# Patient Record
Sex: Female | Born: 1980 | Race: Black or African American | Hispanic: No | Marital: Single | Smoking: Never smoker
Health system: Southern US, Community
[De-identification: ages and names within clinical notes are randomized; demographics above are authoritative.]

---

## 2004-12-15 ENCOUNTER — Ambulatory Visit: Payer: Self-pay | Admitting: *Deleted

## 2004-12-15 ENCOUNTER — Inpatient Hospital Stay (HOSPITAL_COMMUNITY): Admission: AD | Admit: 2004-12-15 | Discharge: 2004-12-19 | Payer: Self-pay | Admitting: Obstetrics and Gynecology

## 2004-12-15 ENCOUNTER — Ambulatory Visit: Payer: Self-pay | Admitting: Family Medicine

## 2004-12-22 ENCOUNTER — Ambulatory Visit: Payer: Self-pay | Admitting: Family Medicine

## 2005-01-04 ENCOUNTER — Inpatient Hospital Stay (HOSPITAL_COMMUNITY): Admission: AD | Admit: 2005-01-04 | Discharge: 2005-01-04 | Payer: Self-pay | Admitting: Obstetrics and Gynecology

## 2005-01-05 ENCOUNTER — Ambulatory Visit: Payer: Self-pay | Admitting: Family Medicine

## 2005-01-12 ENCOUNTER — Ambulatory Visit: Payer: Self-pay | Admitting: Family Medicine

## 2005-01-19 ENCOUNTER — Ambulatory Visit: Payer: Self-pay | Admitting: Family Medicine

## 2005-02-02 ENCOUNTER — Ambulatory Visit: Payer: Self-pay | Admitting: Family Medicine

## 2005-02-16 ENCOUNTER — Ambulatory Visit: Payer: Self-pay | Admitting: Family Medicine

## 2005-03-02 ENCOUNTER — Inpatient Hospital Stay (HOSPITAL_COMMUNITY): Admission: AD | Admit: 2005-03-02 | Discharge: 2005-03-02 | Payer: Self-pay | Admitting: Obstetrics & Gynecology

## 2005-03-02 ENCOUNTER — Ambulatory Visit: Payer: Self-pay | Admitting: Family Medicine

## 2005-03-09 ENCOUNTER — Encounter: Payer: Self-pay | Admitting: Family Medicine

## 2005-03-09 ENCOUNTER — Ambulatory Visit: Payer: Self-pay | Admitting: Family Medicine

## 2005-03-16 ENCOUNTER — Ambulatory Visit: Payer: Self-pay | Admitting: Family Medicine

## 2005-03-17 ENCOUNTER — Ambulatory Visit: Payer: Self-pay | Admitting: Obstetrics and Gynecology

## 2005-03-17 ENCOUNTER — Inpatient Hospital Stay (HOSPITAL_COMMUNITY): Admission: AD | Admit: 2005-03-17 | Discharge: 2005-03-20 | Payer: Self-pay | Admitting: Obstetrics & Gynecology

## 2006-08-02 ENCOUNTER — Emergency Department (HOSPITAL_COMMUNITY): Admission: EM | Admit: 2006-08-02 | Discharge: 2006-08-02 | Payer: Self-pay | Admitting: Emergency Medicine

## 2006-11-18 ENCOUNTER — Emergency Department (HOSPITAL_COMMUNITY): Admission: EM | Admit: 2006-11-18 | Discharge: 2006-11-18 | Payer: Self-pay | Admitting: Emergency Medicine

## 2008-01-11 ENCOUNTER — Inpatient Hospital Stay (HOSPITAL_COMMUNITY): Admission: AD | Admit: 2008-01-11 | Discharge: 2008-01-11 | Payer: Self-pay | Admitting: Obstetrics and Gynecology

## 2008-01-28 ENCOUNTER — Other Ambulatory Visit: Admission: RE | Admit: 2008-01-28 | Discharge: 2008-01-28 | Payer: Self-pay | Admitting: Obstetrics and Gynecology

## 2008-02-18 ENCOUNTER — Ambulatory Visit (HOSPITAL_COMMUNITY): Admission: RE | Admit: 2008-02-18 | Discharge: 2008-02-18 | Payer: Self-pay | Admitting: Obstetrics and Gynecology

## 2008-04-01 ENCOUNTER — Inpatient Hospital Stay (HOSPITAL_COMMUNITY): Admission: AD | Admit: 2008-04-01 | Discharge: 2008-04-01 | Payer: Self-pay | Admitting: Obstetrics and Gynecology

## 2008-04-09 ENCOUNTER — Inpatient Hospital Stay (HOSPITAL_COMMUNITY): Admission: AD | Admit: 2008-04-09 | Discharge: 2008-04-09 | Payer: Self-pay | Admitting: Obstetrics and Gynecology

## 2008-06-29 ENCOUNTER — Inpatient Hospital Stay (HOSPITAL_COMMUNITY): Admission: AD | Admit: 2008-06-29 | Discharge: 2008-06-29 | Payer: Self-pay | Admitting: Obstetrics and Gynecology

## 2008-07-01 ENCOUNTER — Inpatient Hospital Stay (HOSPITAL_COMMUNITY): Admission: AD | Admit: 2008-07-01 | Discharge: 2008-07-03 | Payer: Self-pay | Admitting: Obstetrics and Gynecology

## 2008-07-06 ENCOUNTER — Encounter: Admission: RE | Admit: 2008-07-06 | Discharge: 2008-07-24 | Payer: Self-pay | Admitting: Obstetrics and Gynecology

## 2008-07-09 ENCOUNTER — Observation Stay (HOSPITAL_COMMUNITY): Admission: AD | Admit: 2008-07-09 | Discharge: 2008-07-10 | Payer: Self-pay | Admitting: Obstetrics and Gynecology

## 2008-07-09 ENCOUNTER — Encounter (INDEPENDENT_AMBULATORY_CARE_PROVIDER_SITE_OTHER): Payer: Self-pay | Admitting: Obstetrics and Gynecology

## 2009-10-25 ENCOUNTER — Emergency Department (HOSPITAL_COMMUNITY): Admission: EM | Admit: 2009-10-25 | Discharge: 2009-10-25 | Payer: Self-pay | Admitting: Emergency Medicine

## 2011-04-11 NOTE — Discharge Summary (Signed)
[name], [name]      ACCOUNT NO.:  [id number]   MEDICAL RECORD NO.:  [id number]          PATIENT TYPE:  OBV   LOCATION:  9307                          FACILITY:  [facility]   PHYSICIAN:  Juluis Mire, M.D.   DATE OF BIRTH:  [birth year]   DATE OF ADMISSION:  07/09/2008  DATE OF DISCHARGE:  07/10/2008                               DISCHARGE SUMMARY   ADMITTING DIAGNOSIS:  Retained products of conception with associated  bleeding.   DISCHARGE DIAGNOSIS:  Retained products of conception with associated  bleeding.   PROCEDURE:  D&C.   For complete history and physical, see written note.   COURSE IN THE HOSPITAL:  The patient underwent D&C.  Postop did well.  Postop hemoglobin was 7.2.  The next morning she was afebrile, stable  vital signs, fundus was firm, and bleeding was minimal and she was  discharged home at that time.   In terms of complications, none were encountered during her stay in the  hospital.  The patient was discharged home in stable condition.   DISPOSITION:  The patient was sent home with Methergine.  She will also  take a short course of Keflex.  Darvocet as needed for pain.  Iron  sulfate supplementation.  She is given routine instructions.  She will  call with increasing bleeding, fever, or pain.  Follow up in the office  in one week.      Juluis Mire, M.D.  Electronically Signed     JSM/MEDQ  D:  07/10/2008  T:  07/11/2008  Job:  [id number]

## 2011-04-11 NOTE — Op Note (Signed)
NAMEFRANCA, [name]      ACCOUNT NO.:  [id number]   MEDICAL RECORD NO.:  [id number]          PATIENT TYPE:  INP   LOCATION:  9307                          FACILITY:  [facility]   PHYSICIAN:  Guy Sandifer. Henderson Cloud, M.D. DATE OF BIRTH:  Nov 26, 1981   DATE OF PROCEDURE:  07/09/2008  DATE OF DISCHARGE:                               OPERATIVE REPORT   PREOPERATIVE DIAGNOSIS:  Postpartum hemorrhage.   POSTOPERATIVE DIAGNOSIS:  Postpartum hemorrhage probable retained  products of conception.   PROCEDURE:  Dilatation and evacuation.   SURGEON:  Guy Sandifer. Henderson Cloud, MD   ANESTHESIA:  MAC, Angelica Pou, MD   SPECIMENS:  Endometrial curettings to pathology.   ESTIMATED BLOOD LOSS:  Greater than 500 mL.   INDICATIONS AND CONSENT:  This patient is a 30 year old black female G2,  P2 status post uncomplicated vaginal delivery on July 01, 2008.  She  was discharged home two days later doing well.  She states that she did  well until about 8:30 this evening when she went to the bathroom and had  heavy heavy bleeding into the toilet.  She stated that it was running  out as if she was urinating.  She arrived at [facility] via EMS  with a blood-stained pad beneath her on the stretcher.  Her vital signs  were stable.  Examination in triage revealed at least 300-400 mL of  blood clot in the vagina.  I could not adequately visualize the cervix  secondary to the bleeding.  The patient was too anxious and  uncomfortable to allow any further examination.  CBC, metabolic panel,  and a whole clot were drawn.  Probable dilatation and evacuation was  discussed with the patient.  Potential risks and complications were  reviewed including but not limited to infection, uterine perforation,  organ damage, bleeding requiring transfusion of blood products with HIV  and hepatitis acquisition, DVT, PE, pneumonia, intrauterine synechia,  and secondary infertility and hysterectomy.  All questions were  answered.  Consent was signed on the chart.   PROCEDURE:  The patient was taken to the operating room where she was  sedated and placed in a dorsal lithotomy position.  She was then  prepped, bladder straight catheterized, and she was draped in the  sterile fashion.  Another 200-300 mL of clot were expelled from the  uterus and the vagina with massage.  Pitocin 20 units per L was started  in her IV.  An #8 curved suction curette was then gently passed after  grasping the anterior cervical lip with a ring clamp.  Suction curettage  was carried out.  Further massage on the uterus was carried out after  again removing the instruments.  She was given Methergine 0.2 mg IM as  well.  At this point, the uterus began to firm up quite well.  Gentle  sharp curettage was carried out with alternating suction curettage.  Good hemostasis was attained.  Ultrasound was called in the room.  Ultrasound revealed a small focus of probable remaining tissue.  This  was removed with a sharp curette under direct visualization with the  ultrasound.  Repeated passes with a sharp  curette as  well as suction curettage revealed the cavity to be clean.  It also  appeared to be clean on ultrasound and a good hemostasis was noted.  The  patient's CBC returned during the case with a hemoglobin of  approximately 11.7.  The patient was stable and all counts were correct.  She was taken to the recovery room in stable condition.      Guy Sandifer Henderson Cloud, M.D.  Electronically Signed     JET/MEDQ  D:  07/09/2008  T:  07/10/2008  Job:  [id number]

## 2011-04-14 NOTE — Discharge Summary (Signed)
[name], [name]            ACCOUNT NO.:  [id number]   MEDICAL RECORD NO.:  [id number]          PATIENT TYPE:  INP   LOCATION:  9155                          FACILITY:  [facility]   PHYSICIAN:  Conni Elliot, M.D.DATE OF BIRTH:  November 16, 1981   DATE OF ADMISSION:  12/15/2004  DATE OF DISCHARGE:  12/19/2004                                 DISCHARGE SUMMARY   ADMISSION DIAGNOSES:  1.  Vaginal bleeding, status post RhoGAM x 1.  2.  Intrauterine pregnancy at 26-2/7 weeks by ultrasound.  3.  Preterm uterine contractions.   DISCHARGE DIAGNOSES:  1.  Threatened preterm labor, controlled with Procardia.  2.  Viable intrauterine pregnancy at 26-5/7 weeks on day of discharge.  3.  Status post betamethasone x 3.  4.  Group B Streptococcus negative.   PROCEDURES:  1.  The patient received RhoGAM on the day of admission.  2.  Ultrasound dated December 16, 2004 shows a single intrauterine pregnancy      with a heart rate of 131 beats per minute in the vertex presentation,      with an anterior placenta, no previa, grade 1 placenta, normal amniotic      fluid.  Estimated date of 26 weeks and 2 days, with a weight of 867 g,      and a cervical length of 1.5 cm transvaginally.  3.  Betamethasone x 2.   HISTORY AND PHYSICAL:  The patient is a 30 year old G1 P0 at 26-2/7 weeks  based on an ultrasound on admission, with estimated due date of March 22, 2005, who presented with preterm uterine contractions and a shortened  cervix.  The patient reported contractions on admission, some vaginal  bleeding.  She reported intact membranes and normal fetal activity.  The  patient initially presented to [facility] with some vaginal  spotting and no prenatal care and was referred to [facility] and was  initially admitted by Dr. Henderson Cloud.   PRENATAL COURSE:  The patient has no prenatal care, is on no medications,  and has no known drug allergies.   OBSTETRICAL HISTORY:  G1.   GYNECOLOGIC  HISTORY:  No abnormal Paps.  No cervical implementations.  No  STDs.   MEDICAL HISTORY:  Negative.   SURGICAL HISTORY:  Negative.   FAMILY HISTORY:  Mother and father are both healthy.   SOCIAL HISTORY:  No alcohol, tobacco, or drugs.  Lives with a friend here in  [place] and does not currently work.  Has reliable transportation to and  from the hospital.   PRENATAL LABORATORY RESULTS:  Are incomplete at this time.  The patient has  B negative blood with a negative antibody screen.  Hemoglobin 11.7,  platelets 259.  Rubella immune.  Hepatitis B surface antigen negative.   PHYSICAL EXAMINATION:  Significant for a cervix with fingertip external, 25%  effaced, and a developed lower segment.  Baseline fetal heart rate was 130,  with good variability.  No accels and no decels.  Contractions every two to  three minutes, 30-60 seconds in duration, and mild intensity.   The patient was admitted.  Was treated with Unasyn 3 g IV q.6 h. x 4 days,  and was placed on magnesium and titrated to effect.   HOSPITAL COURSE:  1.  Preterm uterine contractions.  The patient's contractions were managed      with magnesium at 2.5 g/hour.  She was gradually weaned off the      magnesium and switched to Procardia 10 mg p.o. q.6 h. and was      contraction-free on Procardia for one day.  She was also managed with      Motrin 600 mg q.6 h. x 72 hours.  The patient had been off both      magnesium and Motrin 24 hours before discharge and was contraction free      for 24 hours at the time of discharge.  She was discharged home with      Procardia 10 mg p.o. q.6 h., and to follow up in the high risk clinic.      The patient was found to be GBS negative during the hospitalization.  2.  Preterm labor.  The patient was given betamethasone x 2 on December 16, 2004 and December 17, 2004.  She was observed in the hospital for 48      hours after the second dose of betamethasone before discharge home.    DISCHARGE MEDICATIONS:  1.  Procardia 10 mg p.o. q.6 h.  2.  Prenatal vitamins 1 p.o. daily.   FOLLOW-UP INSTRUCTIONS:  The patient is instructed to call and schedule an  appointment at the high-risk clinic.  Number [phone] was provided for the  patient, and she expressed an understanding to do this.  The patient states  that she has good transportation and will be able to stay at home and able  to perform strict bed rest with bathroom privileges, shower privileges, and  cooking privileges.     ______________________________  Priscille Heidelberg. Pamalee Leyden, MD                  ______________________________  Conni Elliot, M.D.    WTP/MEDQ  D:  12/19/2004  T:  12/19/2004  Job:  [id number]

## 2011-08-18 LAB — URINALYSIS, ROUTINE W REFLEX MICROSCOPIC
Bilirubin Urine: NEGATIVE
Ketones, ur: 15 — AB
Nitrite: NEGATIVE
Protein, ur: NEGATIVE
Urobilinogen, UA: 0.2
pH: 6.5

## 2011-08-18 LAB — WET PREP, GENITAL: Clue Cells Wet Prep HPF POC: NONE SEEN

## 2011-08-18 LAB — HCG, QUANTITATIVE, PREGNANCY: hCG, Beta Chain, Quant, S: 161249 — ABNORMAL HIGH

## 2011-08-18 LAB — URINE CULTURE: Culture: NO GROWTH

## 2011-08-18 LAB — POCT PREGNANCY, URINE: Preg Test, Ur: POSITIVE

## 2011-08-18 LAB — URINE MICROSCOPIC-ADD ON

## 2011-08-18 LAB — GC/CHLAMYDIA PROBE AMP, GENITAL: Chlamydia, DNA Probe: NEGATIVE

## 2011-08-25 LAB — COMPREHENSIVE METABOLIC PANEL
ALT: 24
AST: 23
Alkaline Phosphatase: 111
CO2: 25
Chloride: 107
GFR calc Af Amer: >60
GFR calc non Af Amer: >60
Glucose, Bld: 96
Potassium: 3.8
Sodium: 138

## 2011-08-25 LAB — CBC
HCT: 32.6 — ABNORMAL LOW
HCT: 34.3 — ABNORMAL LOW
Hemoglobin: 11 — ABNORMAL LOW
Hemoglobin: 11.6 — ABNORMAL LOW
Hemoglobin: 11.7 — ABNORMAL LOW
MCHC: 33.7
MCHC: 33.8
MCV: 98
MCV: 99.4
RBC: 3.32 — ABNORMAL LOW
RBC: 3.51 — ABNORMAL LOW
RBC: 3.56 — ABNORMAL LOW
RDW: 13
RDW: 13
WBC: 13.2 — ABNORMAL HIGH
WBC: 13.4 — ABNORMAL HIGH

## 2011-08-25 LAB — RH IMMUNE GLOB WKUP(>/=20WKS)(NOT WOMEN'S HOSP)

## 2011-08-25 LAB — SAMPLE TO BLOOD BANK

## 2011-10-13 ENCOUNTER — Emergency Department (INDEPENDENT_AMBULATORY_CARE_PROVIDER_SITE_OTHER)
Admission: EM | Admit: 2011-10-13 | Discharge: 2011-10-13 | Disposition: A | Discharge status: Home / Self Care | Payer: Self-pay | Source: Home / Self Care | Attending: Family Medicine | Admitting: Family Medicine

## 2011-10-13 DIAGNOSIS — J069 Acute upper respiratory infection, unspecified: Secondary | ICD-10-CM

## 2011-10-13 MED ORDER — GUAIFENESIN-CODEINE 100-10 MG/5ML PO SYRP: 5.0000 mL | ORAL_SOLUTION | Freq: Four times a day (QID) | ORAL | Status: AC | PRN

## 2011-10-13 MED ORDER — AZITHROMYCIN 250 MG PO TABS: 250.0000 mg | ORAL_TABLET | Freq: Every day | ORAL | Status: AC

## 2011-10-13 NOTE — ED Notes (Signed)
C/o frontal headache, runny nose, dry cough, bodyaches and pain in back and abdomen with coughing since Tuesday.  Reports she vomited x1 this am.

## 2011-10-13 NOTE — ED Provider Notes (Signed)
History     CSN: [id number] Arrival date & time: 10/13/2011 12:34 PM   First MD Initiated Contact with Patient 10/13/11 1311      Chief Complaint  Patient presents with  . Headache    (Consider location/radiation/quality/duration/timing/severity/associated sxs/prior treatment) Patient is a 30 y.o. female presenting with headaches. The history is provided by the patient.  Headache The primary symptoms include fever. Primary symptoms comment: , fever and cough  has had a fever to 101. Fever has resolved. Cough is dry. Headache is frontal and maxillary  History reviewed. No pertinent past medical history.  History reviewed. No pertinent past surgical history.  History reviewed. No pertinent family history.  History  Substance Use Topics  . Smoking status: Never Smoker   . Smokeless tobacco: Not on file  . Alcohol Use: Yes     occasional    OB History    Grav Para Term Preterm Abortions TAB SAB Ect Mult Living                  Review of Systems  Constitutional: Positive for fever.  HENT: Negative.   Eyes: Negative.   Respiratory: Negative.   Cardiovascular: Negative.   Gastrointestinal: Negative.   Genitourinary: Negative.   Musculoskeletal: Negative.   Skin: Negative.     Allergies  Review of patient's allergies indicates no known allergies.  Home Medications   Current Outpatient Rx  Name Route Sig Dispense Refill  . IBUPROFEN 200 MG PO TABS Oral Take 400 mg by mouth as needed.      . AZITHROMYCIN 250 MG PO TABS Oral Take 1 tablet (250 mg total) by mouth daily. Take first 2 tablets together, then 1 every day until finished. 6 tablet 0  . GUAIFENESIN-CODEINE 100-10 MG/5ML PO SYRP Oral Take 5 mLs by mouth every 6 (six) hours as needed for cough. 120 mL 0    BP 111/77  Pulse 94  Temp(Src) 99.4 F (37.4 C) (Oral)  Resp 16  SpO2 100%  LMP 10/06/2011  Physical Exam  ED Course  Procedures (including critical care time)  Labs Reviewed - No data to  display No results found.   1. URI (upper respiratory infection)       MDM          Randa Spike, MD 10/13/11 [phone]

## 2012-12-14 ENCOUNTER — Emergency Department (INDEPENDENT_AMBULATORY_CARE_PROVIDER_SITE_OTHER)
Admission: EM | Admit: 2012-12-14 | Discharge: 2012-12-14 | Disposition: A | Discharge status: Home / Self Care | Payer: Self-pay | Source: Home / Self Care

## 2012-12-14 ENCOUNTER — Encounter (HOSPITAL_COMMUNITY): Payer: Self-pay | Admitting: Emergency Medicine

## 2012-12-14 DIAGNOSIS — N390 Urinary tract infection, site not specified: Secondary | ICD-10-CM

## 2012-12-14 LAB — POCT URINALYSIS DIP (DEVICE)
Bilirubin Urine: NEGATIVE
Glucose, UA: NEGATIVE mg/dL
Nitrite: NEGATIVE
Urobilinogen, UA: 1 mg/dL (ref 0.0–1.0)
pH: 7 (ref 5.0–8.0)

## 2012-12-14 MED ORDER — NITROFURANTOIN MONOHYD MACRO 100 MG PO CAPS: 100.0000 mg | ORAL_CAPSULE | Freq: Two times a day (BID) | ORAL | Status: DC

## 2012-12-14 NOTE — ED Provider Notes (Signed)
History     CSN: [id number]  Arrival date & time 12/14/12  1121   None     Chief Complaint  Patient presents with  . Urinary Tract Infection     Patient is a 32 y.o. female presenting with frequency. The history is provided by the patient.  Urinary Frequency This is a new problem. The current episode started more than 2 days ago. The problem occurs constantly. The problem has been gradually worsening. Nothing relieves the symptoms.  Pt reports 2 day h/o frequency and urgency of urination. Denies dysuria. Additional symptoms include low back pain and mild suprapubic pressure. Denies fever, abd pain, unusual vag d/c or other gyn symptoms. Does admits to mild nausea w/ one episode of vomiting.  LMP 11/21/2012 History reviewed. No pertinent past medical history.  History reviewed. No pertinent past surgical history.  No family history on file.  History  Substance Use Topics  . Smoking status: Never Smoker   . Smokeless tobacco: Not on file  . Alcohol Use: Yes     Comment: occasional    OB History    Grav Para Term Preterm Abortions TAB SAB Ect Mult Living                  Review of Systems  Genitourinary: Positive for frequency.  All other systems reviewed and are negative.    Allergies  Review of patient's allergies indicates no known allergies.  Home Medications   Current Outpatient Rx  Name  Route  Sig  Dispense  Refill  . IBUPROFEN 200 MG PO TABS   Oral   Take 400 mg by mouth as needed.           Marland Kitchen NITROFURANTOIN MONOHYD MACRO 100 MG PO CAPS   Oral   Take 1 capsule (100 mg total) by mouth 2 (two) times daily. For 7 days   14 capsule   0     BP 117/80  Pulse 73  Temp 97.9 F (36.6 C) (Oral)  Resp 18  SpO2 99%  LMP 11/21/2012  Physical Exam  Vitals reviewed. Constitutional: She is oriented to person, place, and time. She appears well-developed and well-nourished.  HENT:  Head: Normocephalic and atraumatic.  Neck: Neck supple.    Cardiovascular: Normal rate.   Pulmonary/Chest: Effort normal.  Abdominal: Soft. Bowel sounds are normal. There is no tenderness.  Musculoskeletal: Normal range of motion.  Neurological: She is alert and oriented to person, place, and time.  Skin: Skin is warm and dry.    ED Course  Procedures   Labs Reviewed  POCT URINALYSIS DIP (DEVICE) - Abnormal; Notable for the following:    Ketones, ur TRACE (*)     Hgb urine dipstick TRACE (*)     Leukocytes, UA SMALL (*)  Biochemical Testing Only. Please order routine urinalysis from main lab if confirmatory testing is needed.   All other components within normal limits   No results found.   1. Urinary tract infection       MDM  2 days of UTI like symptoms. Small leukocyte esterase, no blood. Preg test neg. Will tx since pt symptomatic. Macrobid x 7 days.        Leanne Chang, NP 12/14/12 1312

## 2012-12-14 NOTE — ED Notes (Signed)
Pt c/o poss UTI x7  Sx include: frequency, urgency, nauseas, vomiting, dark urine, back pain x2 days Denies: hematuria, dysuria, fevers, diarrhea, vag discharge LMP: 11/21/12  She is alert w/no signs of acute distress.

## 2012-12-16 NOTE — ED Provider Notes (Signed)
Medical screening examination/treatment/procedure(s) were performed by resident physician or non-physician practitioner and as supervising physician I was immediately available for consultation/collaboration.   Barkley Bruns MD.    Linna Hoff, MD 12/16/12 [phone]

## 2014-01-09 ENCOUNTER — Encounter (HOSPITAL_COMMUNITY): Payer: Self-pay | Admitting: Emergency Medicine

## 2014-01-09 ENCOUNTER — Emergency Department (INDEPENDENT_AMBULATORY_CARE_PROVIDER_SITE_OTHER)
Admission: EM | Admit: 2014-01-09 | Discharge: 2014-01-09 | Disposition: A | Discharge status: Home / Self Care | Payer: Self-pay | Source: Home / Self Care | Attending: Emergency Medicine | Admitting: Emergency Medicine

## 2014-01-09 DIAGNOSIS — A389 Scarlet fever, uncomplicated: Secondary | ICD-10-CM

## 2014-01-09 DIAGNOSIS — J019 Acute sinusitis, unspecified: Secondary | ICD-10-CM

## 2014-01-09 LAB — POCT RAPID STREP A: Streptococcus, Group A Screen (Direct): POSITIVE — AB

## 2014-01-09 MED ORDER — AMOXICILLIN 500 MG PO CAPS: 500.0000 mg | ORAL_CAPSULE | Freq: Three times a day (TID) | ORAL | Status: DC

## 2014-01-09 NOTE — ED Notes (Signed)
Pt  Reports  Symptoms  Of   Rash    On  Face  That  Itches   As   Well  As  Some  Nasal  Congestion          -  Pt  Reports  Had  Flu  Earlier  pta and  That that is  Better

## 2014-01-09 NOTE — ED Provider Notes (Signed)
  Chief Complaint   Chief Complaint  Patient presents with  . URI    History of Present Illness   [name] is a 33 year old female who's had a one-week history which began with flulike symptoms with generalized aching, vomiting, and subjective fever and then she developed cough, sore throat, and swelling in the neck area. This has all gotten better but now she has nasal congestion with yellow to green to brown drainage, and pressure. Ever since yesterday she's had some itching and rash on her face. No exposure to strep.  Review of Systems   Other than as noted above, the patient denies any of the following symptoms: Systemic:  No fevers, chills, sweats, or myalgias. Eye:  No redness or discharge. ENT:  No ear pain, headache, nasal congestion, drainage, sinus pressure, or sore throat. Neck:  No neck pain, stiffness, or swollen glands. Lungs:  No cough, sputum production, hemoptysis, wheezing, chest tightness, shortness of breath or chest pain. GI:  No abdominal pain, nausea, vomiting or diarrhea.  PMFSH   Past medical history, family history, social history, meds, and allergies were reviewed.   Physical exam   Vital signs:  BP 116/77  Pulse 79  Temp(Src) 98.3 F (36.8 C) (Oral)  Resp 16  SpO2 99%  LMP 12/15/2013 General:  Alert and oriented.  In no distress.  Skin warm and dry. Eye:  No conjunctival injection or drainage. Lids were normal. ENT:  TMs and canals were normal, without erythema or inflammation.  Nasal mucosa was congested with thick, yellow drainage.  Mucous membranes were moist.  Pharynx was erythematous and swollen without any exudate or drainage.  There were no oral ulcerations or lesions. Neck:  Supple, no adenopathy, tenderness or mass. Lungs:  No respiratory distress.  Lungs were clear to auscultation, without wheezes, rales or rhonchi.  Breath sounds were clear and equal bilaterally.  Heart:  Regular rhythm, without gallops, murmers or rubs. Skin:   Clear, warm, and dry, she has a fine, erythematous, sandpaper lesion on her face and is best around her eyes.  Labs   Results for orders placed during the hospital encounter of 01/09/14  POCT RAPID STREP A ([facility]RG CARE ONLY)      Result Value Ref Range   Streptococcus, Group A Screen (Direct) POSITIVE (*) NEGATIVE    Assessment     The primary encounter diagnosis was Scarlet fever. A diagnosis of Acute sinusitis was also pertinent to this visit.  Plan    1.  Meds:  The following meds were prescribed:   Discharge Medication List as of 01/09/2014  1:31 PM    START taking these medications   Details  amoxicillin (AMOXIL) 500 MG capsule Take 1 capsule (500 mg total) by mouth 3 (three) times daily., Starting 01/09/2014, Until Discontinued, Normal        2.  Patient Education/Counseling:  The patient was given appropriate handouts, self care instructions, and instructed in symptomatic relief.  Instructed to get extra fluids, rest, and use a cool mist vaporizer.    3.  Follow up:  The patient was told to follow up here if no better in 3 to 4 days, or sooner if becoming worse in any way, and given some red flag symptoms such as increasing fever, difficulty breathing, chest pain, or persistent vomiting which would prompt immediate return.  Follow up here as needed.      Reuben Likesavid C Keiran Gaffey, MD 01/09/14 [id number]

## 2014-01-09 NOTE — Discharge Instructions (Signed)
Most upper respiratory infections are caused by viruses and do not require antibiotics.  We try to save the antibiotics for when we really need them to prevent bacteria from developing resistance to them.  Here are a few hints about things that can be done at home to help get over an upper respiratory infection quicker: ° °Get extra sleep and extra fluids.  Get 7 to 9 hours of sleep per night and 6 to 8 glasses of water a day.  Getting extra sleep keeps the immune system from getting run down.  Most people with an upper respiratory infection are a little dehydrated.  The extra fluids also keep the secretions liquified and easier to deal with.  Also, get extra vitamin C.  4000 mg per day is the recommended dose. °For the aches, headache, and fever, acetaminophen or ibuprofen are helpful.  These can be alternated every 4 hours.  People with liver disease should avoid large amounts of acetaminophen, and people with ulcer disease, gastroesophageal reflux, gastritis, congestive heart failure, chronic kidney disease, coronary artery disease and the elderly should avoid ibuprofen. °For nasal congestion try Mucinex-D, or if you're having lots of sneezing or clear nasal drainage use Zyrtec-D. People with high blood pressure can take these if their blood pressure is controlled, if not, it's best to avoid the forms with a "D" (decongestants).  You can use the plain Mucinex, Allegra, Claritin, or Zyrtec even if your blood pressure is not controlled.   °A Saline nasal spray such as Ocean Spray can also help.  You can add a decongestant sprays such as Afrin, but you should not use the decongestant sprays for more than 3 or 4 days since they can be habituating.  Breathe Rite nasal strips can also offer a non-drug alternative treatment to nasal congestion, especially at night. °For people with symptoms of sinusitis, sleeping with your head elevated can be helpful.  For sinus pain, moist, hot compresses to the face may provide some  relief.  Many people find that inhaling steam as in a shower or from a pot of steaming water can help. °For any viral infection, zinc containing lozenges such as Cold-Eze or Zicam are helpful.  Zinc helps to fight viral infection.  Hot salt water gargles (8 oz of hot water, 1/2 tsp of table salt, and a pinch of baking soda) can give relief as well as hot beverages such as hot tea.  Sucrets extra strength lozenges will help the sore throat.  °For the cough, take Delsym 2 tsp every 12 hours.  It has also been found recently that Aleve can help control a cough.  The dose is 1 to 2 tablets twice daily with food.  This can be combined with Delsym. (Note, if you are taking ibuprofen, you should not take Aleve as well--take one or the other.) °A cool mist vaporizer will help keep your mucous membranes from drying out.  ° °It's important when you have an upper respiratory infection not to pass the infection to others.  This involves being very careful about the following: ° °Frequent hand washing or use of hand sanitizer, especially after coughing, sneezing, blowing your nose or touching your face, nose or eyes. °Do not shake hands or touch anyone and try to avoid touching surfaces that other people use such as doorknobs, shopping carts, telephones and computer keyboards. °Use tissues and dispose of them properly in a garbage can or ziplock bag. °Cough into your sleeve. °Do not let others eat or   drink after you. ° °It's also important to recognize the signs of serious illness and get evaluated if they occur: °Any respiratory infection that lasts more than 7 to 10 days.  Yellow nasal drainage and sputum are not reliable indicators of a bacterial infection, but if they last for more than 1 week, see your doctor. °Fever and sore throat can indicate strep. °Fever and cough can indicate influenza or pneumonia. °Any kind of severe symptom such as difficulty breathing, intractable vomiting, or severe pain should prompt you to see  a doctor as soon as possible. ° ° °Your body's immune system is really the thing that will get rid of this infection.  Your immune system is comprised of 2 types of specialized cells called T cells and B cells.  T cells coordinate the array of cells in your body that engulf invading bacteria or viruses while B cells orchestrate the production of antibodies that neutralize infection.  Anything we do or any medications we give you, will just strengthen your immune system or help it clear up the infection quicker.  Here are a few helpful hints to improve your immune system to help overcome this illness or to prevent future infections: °· A few vitamins can improve the health of your immune system.  That's why your diet should include plenty of fruits, vegetables, fish, nuts, and whole grains. °· Vitamin A and bet-carotene can increase the cells that fight infections (T cells and B cells).  Vitamin A is abundant in dark greens and orange vegetables such as spinach, greens, sweet potatoes, and carrots. °· Vitamin B6 contributes to the maturation of white blood cells, the cells that fight disease.  Foods with vitamin B6 include cold cereal and bananas. °· Vitamin C is credited with preventing colds because it increases white blood cells and also prevents cellular damage.  Citrus fruits, peaches and green and red bell peppers are all hight in vitamin C. °· Vitamin E is an anti-oxidant that encourages the production of natural killer cells which reject foreign invaders and B cells that produce antibodies.  Foods high in vitamin E include wheat germ, nuts and seeds. °· Foods high in omega-3 fatty acids found in foods like salmon, tuna and mackerel boost your immune system and help cells to engulf and absorb germs. °· Probiotics are good bacteria that increase your T cells.  These can be found in yogurt and are available in supplements such as Culturelle or Align. °· Moderate exercise increases the strength of your immune  system and your ability to recover from illness.  I suggest 3 to 5 moderate intensity 30 minute workouts per week.   °· Sleep is another component of maintaining a strong immune system.  It enables your body to recuperate from the day's activities, stress and work.  My recommendation is to get between 7 and 9 hours of sleep per night. °· If you smoke, try to quit completely or at least cut down.  Drink alcohol only in moderation if at all.  No more than 2 drinks daily for men or 1 for women. °· Get a flu vaccine early in the fall or if you have not gotten one yet, once this illness has run its course.  If you are over 65, a smoker, or an asthmatic, get a pneumococcal vaccine. °· My final recommendation is to maintain a healthy weight.  Excess weight can impair the immune system by interfering with the way the immune system deals with invading viruses or   bacteria.    Scarlet Fever Scarlet fever is an infectious disease that can develop with a strep throat. It usually occurs in school-age children and can spread from person to person (contagious). Scarlet fever seldom causes any long-term problems.  CAUSES Scarlet fever is caused by the bacteria (Streptococcus pyogenes).  SYMPTOMS  Sore throat, fever, and headache.  Mild abdominal pain.  Tongue may become red (strawberry tongue).  Red rash that starts 1 to 2 days after fever begins. Rash starts on face and spreads to rest of body.  Rash looks and feels like "goose bumps" or sandpaper and may itch.  Rash lasts 3 to 7 days and then starts to peel. Peeling may last 2 weeks. DIAGNOSIS Scarlet fever typically is diagnosed by physical exam and throat culture.Rapid strep testing is often available. TREATMENT Antibiotic medicine will be prescribed. It usually takes 24 to 48 hours after beginning antibiotics to start feeling better.  HOME CARE INSTRUCTIONS  Rest and get plenty of sleep.  Take your antibiotics as directed. Finish them even if you  start to feel better.  Gargle a mixture of 1 tsp of salt and 8 oz of water to soothe the throat.  Drink enough fluids to keep your urine clear or pale yellow.  While the throat is very sore, eat soft or liquid foods such as milk, milk shakes, ice cream, frozen yogurts, soups, or instant breakfast milk drinks. Cold sport drinks, smoothies, or frozen ice pops are good choices for hydrating.  Family members who develop a sore throat or fever should see a caregiver.  Only take over-the-counter or prescription medicines for pain, discomfort, or fever as directed by your caregiver. Do not use aspirin.  Follow up with your caregiver about test results if necessary. SEEK MEDICAL CARE IF:  There is no improvement even after 48 to 72 hours of treatment or the symptoms worsen.  There is green, yellow-brown, or bloody phlegm.  There is joint pain or leg swelling.  Paleness, weakness, and fast breathing develop.  There is dry mouth, no urination, or sunken eyes (dehydration).  There is dark brown or bloody urine. SEEK IMMEDIATE MEDICAL CARE IF:  There is drooling or swallowing problems.  There are breathing problems.  There is a voice change.  There is neck pain. MAKE SURE YOU:   Understand these instructions.  Will watch your condition.  Will get help right away if you are not doing well or get worse. Document Released: 11/10/2000 Document Revised: 02/05/2012 Document Reviewed: 05/07/2011 [facility] Patient Information 2014 [place], [place].

## 2014-10-29 ENCOUNTER — Encounter (HOSPITAL_COMMUNITY): Payer: Self-pay | Admitting: Emergency Medicine

## 2014-10-29 ENCOUNTER — Emergency Department (HOSPITAL_COMMUNITY)
Admission: EM | Admit: 2014-10-29 | Discharge: 2014-10-29 | Disposition: A | Discharge status: Home / Self Care | Payer: Medicaid Other | Attending: Emergency Medicine | Admitting: Emergency Medicine

## 2014-10-29 DIAGNOSIS — N390 Urinary tract infection, site not specified: Secondary | ICD-10-CM | POA: Insufficient documentation

## 2014-10-29 DIAGNOSIS — Z79899 Other long term (current) drug therapy: Secondary | ICD-10-CM | POA: Insufficient documentation

## 2014-10-29 DIAGNOSIS — Z792 Long term (current) use of antibiotics: Secondary | ICD-10-CM | POA: Diagnosis not present

## 2014-10-29 DIAGNOSIS — Z3202 Encounter for pregnancy test, result negative: Secondary | ICD-10-CM | POA: Diagnosis not present

## 2014-10-29 DIAGNOSIS — M545 Low back pain: Secondary | ICD-10-CM | POA: Diagnosis present

## 2014-10-29 LAB — URINALYSIS, ROUTINE W REFLEX MICROSCOPIC
Bilirubin Urine: NEGATIVE
Glucose, UA: NEGATIVE mg/dL
HGB URINE DIPSTICK: NEGATIVE
Ketones, ur: NEGATIVE mg/dL
NITRITE: NEGATIVE
Protein, ur: NEGATIVE mg/dL
SPECIFIC GRAVITY, URINE: 1.022 (ref 1.005–1.030)
UROBILINOGEN UA: 0.2 mg/dL (ref 0.0–1.0)
pH: 7.5 (ref 5.0–8.0)

## 2014-10-29 LAB — URINE MICROSCOPIC-ADD ON

## 2014-10-29 LAB — PREGNANCY, URINE: PREG TEST UR: NEGATIVE

## 2014-10-29 MED ORDER — CEPHALEXIN 500 MG PO CAPS: 500.0000 mg | ORAL_CAPSULE | Freq: Four times a day (QID) | ORAL | Status: DC

## 2014-10-29 NOTE — ED Provider Notes (Signed)
CSN: [id number]     Arrival date & time 10/29/14  1502 History   First MD Initiated Contact with Patient 10/29/14 1609     Chief Complaint  Patient presents with  . Back Pain    lower     (Consider location/radiation/quality/duration/timing/severity/associated sxs/prior Treatment) HPI Comments: Patient is a 33 year old female who presents with right lower back pain that started this morning. Symptoms started gradually and progressively worsened since the onset. The pain is burning and moderate without radiation. No aggravating/alleviating factors. Patient denies any injury. Patient is unsure whether she had chicken pox as a child. Patient reports this feels like when she had a UTI last. No other associated symptoms.    History reviewed. No pertinent past medical history. History reviewed. No pertinent past surgical history. No family history on file. History  Substance Use Topics  . Smoking status: Never Smoker   . Smokeless tobacco: Not on file  . Alcohol Use: Yes     Comment: occasional   OB History    No data available     Review of Systems  Constitutional: Negative for fever, chills and fatigue.  HENT: Negative for trouble swallowing.   Eyes: Negative for visual disturbance.  Respiratory: Negative for shortness of breath.   Cardiovascular: Negative for chest pain and palpitations.  Gastrointestinal: Negative for nausea, vomiting, abdominal pain and diarrhea.  Genitourinary: Negative for dysuria and difficulty urinating.  Musculoskeletal: Positive for back pain. Negative for arthralgias and neck pain.  Skin: Negative for color change.  Neurological: Negative for dizziness and weakness.  Psychiatric/Behavioral: Negative for dysphoric mood.      Allergies  Review of patient's allergies indicates no known allergies.  Home Medications   Prior to Admission medications   Medication Sig Start Date End Date Taking? Authorizing Provider  acetaminophen (TYLENOL) 325 MG  tablet Take 650 mg by mouth every 6 (six) hours as needed for moderate pain (throat pain).   Yes Historical Provider, MD  ibuprofen (ADVIL,MOTRIN) 200 MG tablet Take 400 mg by mouth daily as needed.    Yes Historical Provider, MD  amoxicillin (AMOXIL) 500 MG capsule Take 1 capsule (500 mg total) by mouth 3 (three) times daily. Patient not taking: Reported on 10/29/2014 01/09/14   Beth Likesavid C Keller, MD  nitrofurantoin, macrocrystal-monohydrate, (MACROBID) 100 MG capsule Take 1 capsule (100 mg total) by mouth 2 (two) times daily. For 7 days Patient not taking: Reported on 10/29/2014 12/14/12   Beth KayserKatherine P Schorr, NP   BP 112/70 mmHg  Pulse 87  Temp(Src) 97.4 F (36.3 C) (Oral)  Resp 20  SpO2 100%  LMP 10/12/2014 Physical Exam  Constitutional: She is oriented to person, place, and time. She appears well-developed and well-nourished. No distress.  HENT:  Head: Normocephalic and atraumatic.  Eyes: Conjunctivae and EOM are normal.  Neck: Normal range of motion.  Cardiovascular: Normal rate and regular rhythm.  Exam reveals no gallop and no friction rub.   No murmur heard. Pulmonary/Chest: Effort normal and breath sounds normal. She has no wheezes. She has no rales. She exhibits no tenderness.  Abdominal: Soft. She exhibits no distension. There is no tenderness. There is no rebound.  Genitourinary:  No CVA tenderness.   Musculoskeletal: Normal range of motion.  No midline spine tenderness to palpation or paraspinal tenderness.   Neurological: She is alert and oriented to person, place, and time. Coordination normal.  Speech is goal-oriented. Moves limbs without ataxia.   Skin: Skin is warm and dry.  Psychiatric:  She has a normal mood and affect. Her behavior is normal.  Nursing note and vitals reviewed.   ED Course  Procedures (including critical care time) Labs Review Labs Reviewed  URINALYSIS, ROUTINE W REFLEX MICROSCOPIC - Abnormal; Notable for the following:    APPearance CLOUDY (*)     Leukocytes, UA MODERATE (*)    All other components within normal limits  URINE MICROSCOPIC-ADD ON - Abnormal; Notable for the following:    Squamous Epithelial / LPF FEW (*)    Bacteria, UA MANY (*)    All other components within normal limits  PREGNANCY, URINE    Imaging Review No results found.   EKG Interpretation None      MDM   Final diagnoses:  UTI (lower urinary tract infection)    4:29 PM Urinalysis pending. Vitals stable and patient afebrile.   6:00 PM Patient's urinalysis shows UTI. Patient will be treated with Keflex. Vitals stable and patient afebrile.   Beth BeckKaitlyn Ric Rosenberg, PA-C 10/29/14 1803  Beth Munchobert Lockwood, MD 10/30/14 [id number]

## 2014-10-29 NOTE — Discharge Instructions (Signed)
Take Keflex as directed until gone. Refer to attached documents for more information. Return to the ED with worsening or concerning symptoms.

## 2014-10-29 NOTE — ED Notes (Addendum)
Pt c/o lower back pain that started this morning, described as a burning sensation to skin and a pressure. Denies any urinary problems.

## 2015-02-11 ENCOUNTER — Encounter (HOSPITAL_COMMUNITY): Payer: Self-pay

## 2015-02-11 ENCOUNTER — Emergency Department (HOSPITAL_COMMUNITY)
Admission: EM | Admit: 2015-02-11 | Discharge: 2015-02-11 | Disposition: A | Discharge status: Home / Self Care | Payer: Medicaid Other | Attending: Emergency Medicine | Admitting: Emergency Medicine

## 2015-02-11 ENCOUNTER — Emergency Department (HOSPITAL_COMMUNITY): Payer: Medicaid Other

## 2015-02-11 DIAGNOSIS — Z792 Long term (current) use of antibiotics: Secondary | ICD-10-CM | POA: Insufficient documentation

## 2015-02-11 DIAGNOSIS — M25461 Effusion, right knee: Secondary | ICD-10-CM | POA: Diagnosis not present

## 2015-02-11 DIAGNOSIS — Z3202 Encounter for pregnancy test, result negative: Secondary | ICD-10-CM | POA: Diagnosis not present

## 2015-02-11 DIAGNOSIS — Z79899 Other long term (current) drug therapy: Secondary | ICD-10-CM | POA: Diagnosis not present

## 2015-02-11 DIAGNOSIS — M25561 Pain in right knee: Secondary | ICD-10-CM | POA: Diagnosis not present

## 2015-02-11 LAB — POC URINE PREG, ED: Preg Test, Ur: NEGATIVE

## 2015-02-11 MED ORDER — HYDROCODONE-ACETAMINOPHEN 5-325 MG PO TABS: 1.0000 | ORAL_TABLET | Freq: Four times a day (QID) | ORAL | Status: DC | PRN

## 2015-02-11 MED ORDER — HYDROCODONE-ACETAMINOPHEN 5-325 MG PO TABS
1.0000 | ORAL_TABLET | Freq: Once | ORAL | Status: AC
  Administered 2015-02-11: 1 via ORAL
  Filled 2015-02-11: qty 1

## 2015-02-11 MED ORDER — NAPROXEN 500 MG PO TABS: 500.0000 mg | ORAL_TABLET | Freq: Two times a day (BID) | ORAL | Status: DC | PRN

## 2015-02-11 NOTE — Discharge Instructions (Signed)
Wear knee sleeve for comfort. Use crutches as needed for comfort. Ice and elevate knee throughout the day. Alternate between naprosyn and norco for pain relief. Do not drive or operate machinery with pain medication use. Call orthopedic follow up today or tomorrow to schedule followup appointment for 1-2 weeks. Return to the ER for changes or worsening symptoms.   Knee Pain The knee is the complex joint between your thigh and your lower leg. It is made up of bones, tendons, ligaments, and cartilage. The bones that make up the knee are:  The femur in the thigh.  The tibia and fibula in the lower leg.  The patella or kneecap riding in the groove on the lower femur. CAUSES  Knee pain is a common complaint with many causes. A few of these causes are:  Injury, such as:  A ruptured ligament or tendon injury.  Torn cartilage.  Medical conditions, such as:  Gout  Arthritis  Infections  Overuse, over training, or overdoing a physical activity. Knee pain can be minor or severe. Knee pain can accompany debilitating injury. Minor knee problems often respond well to self-care measures or get well on their own. More serious injuries may need medical intervention or even surgery. SYMPTOMS The knee is complex. Symptoms of knee problems can vary widely. Some of the problems are:  Pain with movement and weight bearing.  Swelling and tenderness.  Buckling of the knee.  Inability to straighten or extend your knee.  Your knee locks and you cannot straighten it.  Warmth and redness with pain and fever.  Deformity or dislocation of the kneecap. DIAGNOSIS  Determining what is wrong may be very straight forward such as when there is an injury. It can also be challenging because of the complexity of the knee. Tests to make a diagnosis may include:  Your caregiver taking a history and doing a physical exam.  Routine X-rays can be used to rule out other problems. X-rays will not reveal a  cartilage tear. Some injuries of the knee can be diagnosed by:  Arthroscopy a surgical technique by which a small video camera is inserted through tiny incisions on the sides of the knee. This procedure is used to examine and repair internal knee joint problems. Tiny instruments can be used during arthroscopy to repair the torn knee cartilage (meniscus).  Arthrography is a radiology technique. A contrast liquid is directly injected into the knee joint. Internal structures of the knee joint then become visible on X-ray film.  An MRI scan is a non X-ray radiology procedure in which magnetic fields and a computer produce two- or three-dimensional images of the inside of the knee. Cartilage tears are often visible using an MRI scanner. MRI scans have largely replaced arthrography in diagnosing cartilage tears of the knee.  Blood work.  Examination of the fluid that helps to lubricate the knee joint (synovial fluid). This is done by taking a sample out using a needle and a syringe. TREATMENT The treatment of knee problems depends on the cause. Some of these treatments are:  Depending on the injury, proper casting, splinting, surgery, or physical therapy care will be needed.  Give yourself adequate recovery time. Do not overuse your joints. If you begin to get sore during workout routines, back off. Slow down or do fewer repetitions.  For repetitive activities such as cycling or running, maintain your strength and nutrition.  Alternate muscle groups. For example, if you are a weight lifter, work the upper body on one  day and the lower body the next.  Either tight or weak muscles do not give the proper support for your knee. Tight or weak muscles do not absorb the stress placed on the knee joint. Keep the muscles surrounding the knee strong.  Take care of mechanical problems.  If you have flat feet, orthotics or special shoes may help. See your caregiver if you need help.  Arch supports,  sometimes with wedges on the inner or outer aspect of the heel, can help. These can shift pressure away from the side of the knee most bothered by osteoarthritis.  A brace called an "unloader" brace also may be used to help ease the pressure on the most arthritic side of the knee.  If your caregiver has prescribed crutches, braces, wraps or ice, use as directed. The acronym for this is PRICE. This means protection, rest, ice, compression, and elevation.  Nonsteroidal anti-inflammatory drugs (NSAIDs), can help relieve pain. But if taken immediately after an injury, they may actually increase swelling. Take NSAIDs with food in your stomach. Stop them if you develop stomach problems. Do not take these if you have a history of ulcers, stomach pain, or bleeding from the bowel. Do not take without your caregiver's approval if you have problems with fluid retention, heart failure, or kidney problems.  For ongoing knee problems, physical therapy may be helpful.  Glucosamine and chondroitin are over-the-counter dietary supplements. Both may help relieve the pain of osteoarthritis in the knee. These medicines are different from the usual anti-inflammatory drugs. Glucosamine may decrease the rate of cartilage destruction.  Injections of a corticosteroid drug into your knee joint may help reduce the symptoms of an arthritis flare-up. They may provide pain relief that lasts a few months. You may have to wait a few months between injections. The injections do have a small increased risk of infection, water retention, and elevated blood sugar levels.  Hyaluronic acid injected into damaged joints may ease pain and provide lubrication. These injections may work by reducing inflammation. A series of shots may give relief for as long as 6 months.  Topical painkillers. Applying certain ointments to your skin may help relieve the pain and stiffness of osteoarthritis. Ask your pharmacist for suggestions. Many over  the-counter products are approved for temporary relief of arthritis pain.  In some countries, doctors often prescribe topical NSAIDs for relief of chronic conditions such as arthritis and tendinitis. A review of treatment with NSAID creams found that they worked as well as oral medications but without the serious side effects. PREVENTION  Maintain a healthy weight. Extra pounds put more strain on your joints.  Get strong, stay limber. Weak muscles are a common cause of knee injuries. Stretching is important. Include flexibility exercises in your workouts.  Be smart about exercise. If you have osteoarthritis, chronic knee pain or recurring injuries, you may need to change the way you exercise. This does not mean you have to stop being active. If your knees ache after jogging or playing basketball, consider switching to swimming, water aerobics, or other low-impact activities, at least for a few days a week. Sometimes limiting high-impact activities will provide relief.  Make sure your shoes fit well. Choose footwear that is right for your sport.  Protect your knees. Use the proper gear for knee-sensitive activities. Use kneepads when playing volleyball or laying carpet. Buckle your seat belt every time you drive. Most shattered kneecaps occur in car accidents.  Rest when you are tired. SEEK MEDICAL CARE  IF:  You have knee pain that is continual and does not seem to be getting better.  SEEK IMMEDIATE MEDICAL CARE IF:  Your knee joint feels hot to the touch and you have a high fever. MAKE SURE YOU:   Understand these instructions.  Will watch your condition.  Will get help right away if you are not doing well or get worse. Document Released: 09/10/2007 Document Revised: 02/05/2012 Document Reviewed: 09/10/2007 [facility] Patient Information 2015 [place], [place]. This information is not intended to replace advice given to you by your health care provider. Make sure you discuss any questions you  have with your health care provider.  Cryotherapy Cryotherapy is when you put ice on your injury. Ice helps lessen pain and puffiness (swelling) after an injury. Ice works the best when you start using it in the first 24 to 48 hours after an injury. HOME CARE  Put a dry or damp towel between the ice pack and your skin.  You may press gently on the ice pack.  Leave the ice on for no more than 10 to 20 minutes at a time.  Check your skin after 5 minutes to make sure your skin is okay.  Rest at least 20 minutes between ice pack uses.  Stop using ice when your skin loses feeling (numbness).  Do not use ice on someone who cannot tell you when it hurts. This includes small children and people with memory problems (dementia). GET HELP RIGHT AWAY IF:  You have white spots on your skin.  Your skin turns blue or pale.  Your skin feels waxy or hard.  Your puffiness gets worse. MAKE SURE YOU:   Understand these instructions.  Will watch your condition.  Will get help right away if you are not doing well or get worse. Document Released: 05/01/2008 Document Revised: 02/05/2012 Document Reviewed: 07/06/2011 [facility] Patient Information 2015 [place], [place]. This information is not intended to replace advice given to you by your health care provider. Make sure you discuss any questions you have with your health care provider.

## 2015-02-11 NOTE — ED Notes (Addendum)
Pt reports c/o knee pain.  Pt reports that right knee began hurting 02/05/15 and right knee began hurting last night. Pt was able to ambulate to triage room without difficulty.  Pt states R knee is tender to the touch but denies tenderness in L knee.   Pt reports taking 2 capsules of Motrin last night.  Pt states Motrin temporarily relieved pain.  Pt states that knee pain is worse when bending her knees.

## 2015-02-11 NOTE — ED Provider Notes (Signed)
CSN: [id number]     Arrival date & time 02/11/15  1119 History   First MD Initiated Contact with Patient 02/11/15 1141     Chief Complaint  Patient presents with  . Knee Pain     (Consider location/radiation/quality/duration/timing/severity/associated sxs/prior Treatment) HPI Comments: [name] is a 34 y.o. female who presents to the ED with complaints of right knee pain 1 week. Patient states she is a runner and she has been increasing her running routine, and developed gradual onset right knee aching, 7/10, which radiates into the ankle, constant, worse with walking and going up stairs, unrelieved with ice, and mildly relieved with ibuprofen. She denies any specific twisting injury. Reports mild swelling medially with no erythema or warmth. Denies any fevers, numbness, dealing, weakness, hip pain, history of gout, recent travel/surgery/immobilization/OCPs. Denies any calf tenderness. States that yesterday she developed some left knee pain due to compensating for the right knee being painful. Denies any tenderness in this left knee.  Patient is a 34 y.o. female presenting with knee pain. The history is provided by the patient. No language interpreter was used.  Knee Pain Location:  Knee Time since incident:  1 week Injury: no   Knee location:  R knee Pain details:    Quality:  Aching   Radiates to:  R leg   Severity:  Moderate (7/10)   Onset quality:  Gradual   Duration:  1 week   Timing:  Constant   Progression:  Unchanged Chronicity:  New Prior injury to area:  No Relieved by:  Compression and NSAIDs Worsened by:  Bearing weight (and walking upstairs) Ineffective treatments:  Ice Associated symptoms: swelling   Associated symptoms: no back pain, no decreased ROM, no fever, no muscle weakness, no numbness, no stiffness and no tingling     History reviewed. No pertinent past medical history. History reviewed. No pertinent past surgical history. History reviewed. No  pertinent family history. History  Substance Use Topics  . Smoking status: Never Smoker   . Smokeless tobacco: Not on file  . Alcohol Use: Yes     Comment: occasional   OB History    No data available     Review of Systems  Constitutional: Negative for fever and chills.  Respiratory: Negative for shortness of breath.   Cardiovascular: Negative for chest pain.  Musculoskeletal: Positive for joint swelling (R knee) and arthralgias (R knee). Negative for myalgias, back pain, gait problem and stiffness.  Skin: Negative for color change.  Neurological: Negative for weakness and numbness.   10 Systems reviewed and are negative for acute change except as noted in the HPI.    Allergies  Review of patient's allergies indicates no known allergies.  Home Medications   Prior to Admission medications   Medication Sig Start Date End Date Taking? Authorizing Provider  acetaminophen (TYLENOL) 325 MG tablet Take 650 mg by mouth every 6 (six) hours as needed for moderate pain (throat pain).    Historical Provider, MD  amoxicillin (AMOXIL) 500 MG capsule Take 1 capsule (500 mg total) by mouth 3 (three) times daily. Patient not taking: Reported on 10/29/2014 01/09/14   Reuben Likes, MD  cephALEXin (KEFLEX) 500 MG capsule Take 1 capsule (500 mg total) by mouth 4 (four) times daily. 10/29/14   Kaitlyn Szekalski, PA-C  ibuprofen (ADVIL,MOTRIN) 200 MG tablet Take 400 mg by mouth daily as needed.     Historical Provider, MD  nitrofurantoin, macrocrystal-monohydrate, (MACROBID) 100 MG capsule Take 1 capsule (100 mg  total) by mouth 2 (two) times daily. For 7 days Patient not taking: Reported on 10/29/2014 12/14/12   Roma KayserKatherine P Schorr, NP   BP 115/73 mmHg  Pulse 68  Temp(Src) 98.5 F (36.9 C) (Oral)  Resp 14  Ht 5\' 1"  (1.549 m)  Wt 125 lb (56.7 kg)  BMI 23.63 kg/m2  SpO2 99%  LMP 01/11/2015 Physical Exam  Constitutional: She is oriented to person, place, and time. Vital signs are normal. She  appears well-developed and well-nourished.  Non-toxic appearance. No distress.  Afebrile, nontoxic, NAD  HENT:  Head: Normocephalic and atraumatic.  Mouth/Throat: Mucous membranes are normal.  Eyes: Conjunctivae and EOM are normal. Right eye exhibits no discharge. Left eye exhibits no discharge.  Neck: Normal range of motion. Neck supple.  Cardiovascular: Normal rate and intact distal pulses.   Pulmonary/Chest: Effort normal. No respiratory distress.  Abdominal: Normal appearance. She exhibits no distension.  Musculoskeletal:       Right knee: She exhibits swelling, bony tenderness and MCL laxity (slight). She exhibits normal range of motion, no effusion, no deformity, no erythema, normal alignment, no LCL laxity and normal patellar mobility. Tenderness found. Medial joint line tenderness noted.       Legs: R knee with FROM intact, moderate medial joint line and bony TTP, +swelling without effusion/deformity, no bruising/erythema/warmth, no abnormal alignment or patellar mobility, no LCL laxity but slightly +MCL laxity, neg anterior drawer test, no crepitus.  Strength and sensation grossly intact Gait steady Neg homan's sign bilaterally  Neurological: She is alert and oriented to person, place, and time. She has normal strength. No sensory deficit. Gait normal.  Skin: Skin is warm, dry and intact. No bruising and no rash noted. No erythema.  Psychiatric: She has a normal mood and affect. Her behavior is normal.  Nursing note and vitals reviewed.   ED Course  Procedures (including critical care time) Labs Review Labs Reviewed - No data to display  Imaging Review Dg Knee Complete 4 Views Right  02/11/2015   CLINICAL DATA:  Acute right knee pain for 2 weeks. No known injury. Initial encounter.  EXAM: RIGHT KNEE - COMPLETE 4+ VIEW  COMPARISON:  None.  FINDINGS: There is no evidence of fracture, dislocation, or joint effusion. There is no evidence of arthropathy or other focal bone  abnormality. Soft tissues are unremarkable.  IMPRESSION: Normal right knee.   Electronically Signed   By: Lupita RaiderJames  Green Jr, M.D.   On: 02/11/2015 13:26     EKG Interpretation None      MDM   Final diagnoses:  Right knee pain    34 y.o. female with R knee pain x1wk. Runner. Neurovascularly intact with soft compartments, neg homan's bilaterally. Medial jointline tenderness with swelling. Very exquisite tenderness to tibial plateau, will obtain imaging. Could be meniscal injury vs ligamentous injury, but given that pt is a runner will r/o stress fx. Will give norco here and reassess after imaging.   1:48 PM Xray neg. Advised continued use of compression. Will give crutches for comfort. Discussed RICE therapy. Will have her see ortho for likely meniscal injury. Discussed naprosyn and norco use for pain. I explained the diagnosis and have given explicit precautions to return to the ER including for any other new or worsening symptoms. The patient understands and accepts the medical plan as it's been dictated and I have answered their questions. Discharge instructions concerning home care and prescriptions have been given. The patient is STABLE and is discharged to home in good  condition.   BP 115/73 mmHg  Pulse 68  Temp(Src) 98.5 F (36.9 C) (Oral)  Resp 14  Ht  (1.549 m)  Wt 125 lb (56.7 kg)  BMI 23.63 kg/m2  SpO2 99%  LMP 01/11/2015  Meds ordered this encounter  Medications  . HYDROcodone-acetaminophen (NORCO/VICODIN) 5-325 MG per tablet 1 tablet    Sig:   . naproxen (NAPROSYN) 500 MG tablet    Sig: Take 1 tablet (500 mg total) by mouth 2 (two) times daily as needed for mild pain, moderate pain or headache (TAKE WITH MEALS.).    Dispense:  20 tablet    Refill:  0    Order Specific Question:  Supervising Provider    Answer:  MILLER, BRIAN [[id number]]  . HYDROcodone-acetaminophen (NORCO) 5-325 MG per tablet    Sig: Take 1 tablet by mouth every 6 (six) hours as needed for severe  pain.    Dispense:  10 tablet    Refill:  0    Order Specific Question:  Supervising Provider    Answer:  Eusebio Friendly     Yesennia Hirota Camprubi-Soms, PA-C 02/11/15 1350  Tilden Fossa, MD 02/11/15 1443

## 2015-08-05 ENCOUNTER — Emergency Department (HOSPITAL_COMMUNITY)
Admission: EM | Admit: 2015-08-05 | Discharge: 2015-08-05 | Disposition: A | Discharge status: Home / Self Care | Payer: Medicaid Other | Attending: Emergency Medicine | Admitting: Emergency Medicine

## 2015-08-05 ENCOUNTER — Encounter (HOSPITAL_COMMUNITY): Payer: Self-pay | Admitting: Emergency Medicine

## 2015-08-05 DIAGNOSIS — N390 Urinary tract infection, site not specified: Secondary | ICD-10-CM | POA: Diagnosis not present

## 2015-08-05 DIAGNOSIS — Z3202 Encounter for pregnancy test, result negative: Secondary | ICD-10-CM | POA: Diagnosis not present

## 2015-08-05 DIAGNOSIS — R110 Nausea: Secondary | ICD-10-CM | POA: Diagnosis present

## 2015-08-05 LAB — CBC WITH DIFFERENTIAL/PLATELET
BASOS ABS: 0 10*3/uL (ref 0.0–0.1)
Basophils Relative: 0 % (ref 0–1)
EOS PCT: 1 % (ref 0–5)
Eosinophils Absolute: 0.1 10*3/uL (ref 0.0–0.7)
HEMATOCRIT: 40.7 % (ref 36.0–46.0)
Hemoglobin: 13.5 g/dL (ref 12.0–15.0)
LYMPHS ABS: 2.7 10*3/uL (ref 0.7–4.0)
LYMPHS PCT: 27 % (ref 12–46)
MCH: 30.9 pg (ref 26.0–34.0)
MCHC: 33.2 g/dL (ref 30.0–36.0)
MCV: 93.1 fL (ref 78.0–100.0)
MONO ABS: 0.8 10*3/uL (ref 0.1–1.0)
MONOS PCT: 8 % (ref 3–12)
NEUTROS ABS: 6.3 10*3/uL (ref 1.7–7.7)
Neutrophils Relative %: 64 % (ref 43–77)
Platelets: 314 10*3/uL (ref 150–400)
RBC: 4.37 MIL/uL (ref 3.87–5.11)
RDW: 11.8 % (ref 11.5–15.5)
WBC: 9.9 10*3/uL (ref 4.0–10.5)

## 2015-08-05 LAB — URINE MICROSCOPIC-ADD ON

## 2015-08-05 LAB — URINALYSIS, ROUTINE W REFLEX MICROSCOPIC
BILIRUBIN URINE: NEGATIVE
GLUCOSE, UA: NEGATIVE mg/dL
KETONES UR: NEGATIVE mg/dL
NITRITE: NEGATIVE
PH: 5.5 (ref 5.0–8.0)
Protein, ur: NEGATIVE mg/dL
Specific Gravity, Urine: 1.024 (ref 1.005–1.030)
Urobilinogen, UA: 0.2 mg/dL (ref 0.0–1.0)

## 2015-08-05 LAB — COMPREHENSIVE METABOLIC PANEL
ALT: 23 U/L (ref 14–54)
AST: 32 U/L (ref 15–41)
Albumin: 5.1 g/dL — ABNORMAL HIGH (ref 3.5–5.0)
Alkaline Phosphatase: 73 U/L (ref 38–126)
Anion gap: 11 (ref 5–15)
BILIRUBIN TOTAL: 1 mg/dL (ref 0.3–1.2)
BUN: 13 mg/dL (ref 6–20)
CO2: 25 mmol/L (ref 22–32)
CREATININE: 0.82 mg/dL (ref 0.44–1.00)
Calcium: 9.5 mg/dL (ref 8.9–10.3)
Chloride: 102 mmol/L (ref 101–111)
Glucose, Bld: 118 mg/dL — ABNORMAL HIGH (ref 65–99)
POTASSIUM: 3.6 mmol/L (ref 3.5–5.1)
Sodium: 138 mmol/L (ref 135–145)
TOTAL PROTEIN: 8.1 g/dL (ref 6.5–8.1)

## 2015-08-05 LAB — POC URINE PREG, ED: Preg Test, Ur: NEGATIVE

## 2015-08-05 LAB — LIPASE, BLOOD: LIPASE: 18 U/L — AB (ref 22–51)

## 2015-08-05 LAB — WET PREP, GENITAL
TRICH WET PREP: NONE SEEN
Yeast Wet Prep HPF POC: NONE SEEN

## 2015-08-05 MED ORDER — CEFTRIAXONE SODIUM 250 MG IJ SOLR
250.0000 mg | Freq: Once | INTRAMUSCULAR | Status: AC
  Administered 2015-08-05: 250 mg via INTRAMUSCULAR
  Filled 2015-08-05: qty 250

## 2015-08-05 MED ORDER — CEPHALEXIN 500 MG PO CAPS: 500.0000 mg | ORAL_CAPSULE | Freq: Four times a day (QID) | ORAL | Status: DC

## 2015-08-05 MED ORDER — ONDANSETRON HCL 4 MG/2ML IJ SOLN
4.0000 mg | Freq: Once | INTRAMUSCULAR | Status: AC
Start: 2015-08-05 — End: 2015-08-05
  Administered 2015-08-05: 4 mg via INTRAVENOUS
  Filled 2015-08-05: qty 2

## 2015-08-05 MED ORDER — LIDOCAINE HCL (PF) 1 % IJ SOLN
INTRAMUSCULAR | Status: AC
  Administered 2015-08-05: 0.9 mL
  Filled 2015-08-05: qty 5

## 2015-08-05 MED ORDER — KETOROLAC TROMETHAMINE 30 MG/ML IJ SOLN
60.0000 mg | Freq: Once | INTRAMUSCULAR | Status: DC
  Filled 2015-08-05: qty 2

## 2015-08-05 MED ORDER — AZITHROMYCIN 250 MG PO TABS
1000.0000 mg | ORAL_TABLET | Freq: Once | ORAL | Status: AC
Start: 2015-08-05 — End: 2015-08-05
  Administered 2015-08-05: 1000 mg via ORAL
  Filled 2015-08-05: qty 4

## 2015-08-05 MED ORDER — KETOROLAC TROMETHAMINE 30 MG/ML IJ SOLN
30.0000 mg | Freq: Once | INTRAMUSCULAR | Status: AC
  Administered 2015-08-05: 30 mg via INTRAVENOUS

## 2015-08-05 NOTE — Discharge Instructions (Signed)
Urinary Tract Infection Drink plenty of fluids.  Take antibiotics as prescribed. Follow up with women's outpatient clinic.  A urinary tract infection (UTI) can occur any place along the urinary tract. The tract includes the kidneys, ureters, bladder, and urethra. A type of germ called bacteria often causes a UTI. UTIs are often helped with antibiotic medicine.  HOME CARE   If given, take antibiotics as told by your doctor. Finish them even if you start to feel better.  Drink enough fluids to keep your pee (urine) clear or pale yellow.  Avoid tea, drinks with caffeine, and bubbly (carbonated) drinks.  Pee often. Avoid holding your pee in for a long time.  Pee before and after having sex (intercourse).  Wipe from front to back after you poop (bowel movement) if you are a woman. Use each tissue only once. GET HELP RIGHT AWAY IF:   You have back pain.  You have lower belly (abdominal) pain.  You have chills.  You feel sick to your stomach (nauseous).  You throw up (vomit).  Your burning or discomfort with peeing does not go away.  You have a fever.  Your symptoms are not better in 3 days. MAKE SURE YOU:   Understand these instructions.  Will watch your condition.  Will get help right away if you are not doing well or get worse. Document Released: 05/01/2008 Document Revised: 08/07/2012 Document Reviewed: 06/13/2012 [facility] Patient Information 2015 [place], [place]. This information is not intended to replace advice given to you by your health care provider. Make sure you discuss any questions you have with your health care provider.

## 2015-08-05 NOTE — ED Provider Notes (Signed)
CSN: [id number]     Arrival date & time 08/05/15  4098 History   First MD Initiated Contact with Patient 08/05/15 [phone]     Chief Complaint  Patient presents with  . Nausea  . Back Pain     (Consider location/radiation/quality/duration/timing/severity/associated sxs/prior Treatment) Patient is a 34 y.o. female presenting with back pain. The history is provided by the patient. No language interpreter was used.  Back Pain Associated symptoms: no abdominal pain and no fever   [name] is a 34 y.o female who presents for intermittent nausea, vomiting, right-sided back pain that began 5 days ago. She states it is worse with movement and relieved when lying on her left side.  She took Advil for pain with minimal relief. She thought that she may have had a fever 2 days ago and took her temperature which was normal. She states she has had a UTI in the past that presented the same way.  She is also having white non odorous vaginal discharge but no vaginal bleeding or itching. She is sexually active with the same partner for the past 10 years. LMP was 3 weeks ago.  She denies any chills, chest pain, shortness of breath, abdominal pain, diarrhea, constipation, dysuria, hematuria. History reviewed. No pertinent past medical history. History reviewed. No pertinent past surgical history. History reviewed. No pertinent family history. Social History  Substance Use Topics  . Smoking status: Never Smoker   . Smokeless tobacco: None  . Alcohol Use: Yes     Comment: occasional   OB History    No data available     Review of Systems  Constitutional: Negative for fever and chills.  Gastrointestinal: Positive for nausea and vomiting. Negative for abdominal pain.  Genitourinary: Negative for flank pain.  Musculoskeletal: Positive for back pain.  All other systems reviewed and are negative.     Allergies  Garlic and Onion  Home Medications   Prior to Admission medications   Medication Sig  Start Date End Date Taking? Authorizing Provider  ibuprofen (ADVIL,MOTRIN) 200 MG tablet Take 400 mg by mouth daily as needed for moderate pain.    Yes Historical Provider, MD  amoxicillin (AMOXIL) 500 MG capsule Take 1 capsule (500 mg total) by mouth 3 (three) times daily. Patient not taking: Reported on 10/29/2014 01/09/14   Reuben Likes, MD  cephALEXin (KEFLEX) 500 MG capsule Take 1 capsule (500 mg total) by mouth 4 (four) times daily. 08/05/15   Harlyn Rathmann Patel-Mills, PA-C  HYDROcodone-acetaminophen (NORCO) 5-325 MG per tablet Take 1 tablet by mouth every 6 (six) hours as needed for severe pain. Patient not taking: Reported on 08/05/2015 02/11/15   Mercedes Camprubi-Soms, PA-C  naproxen (NAPROSYN) 500 MG tablet Take 1 tablet (500 mg total) by mouth 2 (two) times daily as needed for mild pain, moderate pain or headache (TAKE WITH MEALS.). Patient not taking: Reported on 08/05/2015 02/11/15   Mercedes Camprubi-Soms, PA-C  nitrofurantoin, macrocrystal-monohydrate, (MACROBID) 100 MG capsule Take 1 capsule (100 mg total) by mouth 2 (two) times daily. For 7 days Patient not taking: Reported on 10/29/2014 12/14/12   Roma Kayser Schorr, NP   BP 139/79 mmHg  Pulse 72  Temp(Src) 98 F (36.7 C) (Oral)  Resp 18  Ht 5\' 3"  (1.6 m)  Wt 130 lb (58.968 kg)  BMI 23.03 kg/m2  SpO2 100%  LMP 07/17/2015 (Exact Date) Physical Exam  Constitutional: She is oriented to person, place, and time. She appears well-developed and well-nourished. No distress.  HENT:  Head: Normocephalic and atraumatic.  Eyes: Conjunctivae are normal.  Neck: Normal range of motion. Neck supple.  Cardiovascular: Normal rate, regular rhythm and normal heart sounds.   Pulmonary/Chest: Effort normal and breath sounds normal.  Abdominal: Soft. She exhibits no distension. There is no tenderness. There is no rebound, no guarding and no CVA tenderness.  Abdomen is soft and nontender. No guarding or rebound. No CVA tenderness. No pelvic pain or  tenderness.   Genitourinary: No tenderness or bleeding in the vagina. Vaginal discharge found.  Pelvic exam: Chaperone present.  Thin greenish non odorous discharge. No vaginal bleeding.  No adnexal tenderness.  Cervical os closed.  Musculoskeletal: Normal range of motion.       Back:  Tenderness to palpation over the left gluteus.  Neurological: She is alert and oriented to person, place, and time.  Skin: Skin is warm and dry.  Psychiatric: She has a normal mood and affect. Her behavior is normal.  Nursing note and vitals reviewed.   ED Course  Procedures (including critical care time) Labs Review Labs Reviewed  WET PREP, GENITAL - Abnormal; Notable for the following:    Clue Cells Wet Prep HPF POC MANY (*)    WBC, Wet Prep HPF POC TOO NUMEROUS TO COUNT (*)    All other components within normal limits  COMPREHENSIVE METABOLIC PANEL - Abnormal; Notable for the following:    Glucose, Bld 118 (*)    Albumin 5.1 (*)    All other components within normal limits  LIPASE, BLOOD - Abnormal; Notable for the following:    Lipase 18 (*)    All other components within normal limits  URINALYSIS, ROUTINE W REFLEX MICROSCOPIC (NOT AT [facility]) - Abnormal; Notable for the following:    APPearance CLOUDY (*)    Hgb urine dipstick TRACE (*)    Leukocytes, UA MODERATE (*)    All other components within normal limits  URINE MICROSCOPIC-ADD ON - Abnormal; Notable for the following:    Squamous Epithelial / LPF MANY (*)    Bacteria, UA MANY (*)    All other components within normal limits  URINE CULTURE  CBC WITH DIFFERENTIAL/PLATELET  POC URINE PREG, ED  GC/CHLAMYDIA PROBE AMP ([place]) NOT AT [facility]    Imaging Review No results found. I have personally reviewed and evaluated these lab results as part of my medical decision-making.   EKG Interpretation None      MDM   Final diagnoses:  UTI (lower urinary tract infection)  Patient presents for right sided back pain and N/V. Her  vitals are stable and she is well-appearing. Her labs are unremarkable. Wet prep shows too numerous to count WBCs and many clue cells. She also has a UTI. She was treated as though she had an infection with azithromycin and Rocephin. She was given Keflex for UTI. Urine culture pending. I also discussed that GC and chlamydia results would not be available for 4-5 days and that the hospital would call her if they were positive. I discussed following up with women's outpatient clinic. I gave the patient return precautions and she verbally agrees with the plan.     Catha Gosselin, PA-C 08/05/15 1438  Raeford Razor, MD 08/06/15 [phone]

## 2015-08-05 NOTE — Progress Notes (Signed)
Pt pcp is listed as general medical per Medicaid [place] access response hx Cm placed this in EPIC  Cm notified pt via f/u d/c instructions with DSS number to contact if this is not the preferred pcp

## 2015-08-05 NOTE — ED Notes (Signed)
Pt c/o nausea and lower back pain x 5 days.

## 2015-08-05 NOTE — ED Notes (Signed)
Reviewed d/c instructions and meds. Pt verbalized understanding

## 2015-08-06 LAB — URINE CULTURE
Culture: NO GROWTH
SPECIAL REQUESTS: NORMAL

## 2015-08-06 LAB — GC/CHLAMYDIA PROBE AMP (~~LOC~~) NOT AT ARMC
CHLAMYDIA, DNA PROBE: NEGATIVE
NEISSERIA GONORRHEA: NEGATIVE

## 2016-01-30 ENCOUNTER — Encounter (HOSPITAL_COMMUNITY): Payer: Self-pay | Admitting: *Deleted

## 2016-01-30 ENCOUNTER — Emergency Department (HOSPITAL_COMMUNITY)
Admission: EM | Admit: 2016-01-30 | Discharge: 2016-01-30 | Disposition: A | Discharge status: Home / Self Care | Payer: Medicaid Other | Attending: Emergency Medicine | Admitting: Emergency Medicine

## 2016-01-30 DIAGNOSIS — R05 Cough: Secondary | ICD-10-CM | POA: Diagnosis present

## 2016-01-30 DIAGNOSIS — J111 Influenza due to unidentified influenza virus with other respiratory manifestations: Secondary | ICD-10-CM | POA: Insufficient documentation

## 2016-01-30 DIAGNOSIS — Z792 Long term (current) use of antibiotics: Secondary | ICD-10-CM | POA: Diagnosis not present

## 2016-01-30 DIAGNOSIS — R000 Tachycardia, unspecified: Secondary | ICD-10-CM | POA: Insufficient documentation

## 2016-01-30 DIAGNOSIS — R69 Illness, unspecified: Secondary | ICD-10-CM

## 2016-01-30 LAB — RAPID STREP SCREEN (MED CTR MEBANE ONLY): Streptococcus, Group A Screen (Direct): NEGATIVE

## 2016-01-30 MED ORDER — ACETAMINOPHEN 325 MG PO TABS
650.0000 mg | ORAL_TABLET | Freq: Once | ORAL | Status: AC | PRN
  Administered 2016-01-30: 650 mg via ORAL
  Filled 2016-01-30: qty 2

## 2016-01-30 MED ORDER — ONDANSETRON HCL 4 MG PO TABS: 4.0000 mg | ORAL_TABLET | Freq: Four times a day (QID) | ORAL | Status: DC

## 2016-01-30 NOTE — ED Provider Notes (Signed)
CSN: [id number]     Arrival date & time 01/30/16  [id number] History   First MD Initiated Contact with Patient 01/30/16 1117     Chief Complaint  Patient presents with  . Sore Throat  . Cough   HPI Comments: 35 year old female presents with acute onset of cough and malaise for the past 3 days. She reports associated fever, body aches, nasal congestion, sore throat, and nausea. Denies headache, ear pain/drainage, rhinorrhea, chest pain, SOB, vomiting, or diarrhea. 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 Has tried OTC therapy with minimal relief.   The history is provided by the patient.    History reviewed. No pertinent past medical history. History reviewed. No pertinent past surgical history. No family history on file. Social History  Substance Use Topics  . Smoking status: Never Smoker   . Smokeless tobacco: None  . Alcohol Use: Yes     Comment: occasional   OB History    No data available     Review of Systems  Constitutional: Positive for fever, appetite change and fatigue.       Decreased  HENT: Positive for congestion and sore throat.   Respiratory: Positive for cough. Negative for shortness of breath.   Cardiovascular: Negative for chest pain.  Gastrointestinal: Positive for nausea. Negative for vomiting and diarrhea.      Allergies  Garlic and Onion  Home Medications   Prior to Admission medications   Medication Sig Start Date End Date Taking? Authorizing Provider  amoxicillin (AMOXIL) 500 MG capsule Take 1 capsule (500 mg total) by mouth 3 (three) times daily. Patient not taking: Reported on 10/29/2014 01/09/14   Reuben Likesavid C Keller, MD  cephALEXin (KEFLEX) 500 MG capsule Take 1 capsule (500 mg total) by mouth 4 (four) times daily. 08/05/15   Hanna Patel-Mills, PA-C  HYDROcodone-acetaminophen (NORCO) 5-325 MG per tablet Take 1 tablet by mouth every 6 (six) hours as needed for severe pain. Patient not taking: Reported on 08/05/2015  02/11/15   Mercedes Camprubi-Soms, PA-C  ibuprofen (ADVIL,MOTRIN) 200 MG tablet Take 400 mg by mouth daily as needed for moderate pain.     Historical Provider, MD  naproxen (NAPROSYN) 500 MG tablet Take 1 tablet (500 mg total) by mouth 2 (two) times daily as needed for mild pain, moderate pain or headache (TAKE WITH MEALS.). Patient not taking: Reported on 08/05/2015 02/11/15   Mercedes Camprubi-Soms, PA-C  nitrofurantoin, macrocrystal-monohydrate, (MACROBID) 100 MG capsule Take 1 capsule (100 mg total) by mouth 2 (two) times daily. For 7 days Patient not taking: Reported on 10/29/2014 12/14/12   Roma KayserKatherine P Schorr, NP   BP 122/87 mmHg  Pulse 118  Temp(Src) 100.4 F (38 C) (Oral)  Resp 17  SpO2 100%  LMP 01/16/2016 Physical Exam  Constitutional: She appears well-developed and well-nourished. No distress.  HENT:  Head: Normocephalic and atraumatic.  Right Ear: Tympanic membrane, external ear and ear canal normal.  Left Ear: Tympanic membrane, external ear and ear canal normal.  Mouth/Throat: Uvula is midline, oropharynx is clear and moist and mucous membranes are normal.  Erythematous nares bilaterally  Cardiovascular: Regular rhythm.  Tachycardia present.   Pulmonary/Chest: Effort normal and breath sounds normal. No accessory muscle usage. No respiratory distress.  Skin: Skin is warm and dry.  Psychiatric: She has a normal mood and affect.    ED Course  Procedures (including critical care time) Labs Review Labs Reviewed  RAPID STREP SCREEN (  NOT AT [facility])  CULTURE, GROUP A STREP [facility])   Discussed with patient that rapid strep was negative therefore due to her flu-like symptoms would treat accordingly. Zofran prn for nausea, push fluids, Tylenol or NSAIDs for fever/pain reduction as needed. Work note given. Advised to return to the ED if symptoms worsen.  MDM   Final diagnoses:  Influenza-like illness   Viral vs bacterial etiology. Rapid strep was negative therefore due to being  febrile and her risk factors of not having a flu shot this year and working in a nursing home, symptoms were most likely to be flu. Vitals were rechecked which showed an improvement in temp however pulse was unchanged. Since she was non-toxic, able to tolerate liquids, and not complaining of chest pain and there were no signs/symptoms concerning for PE, she was able to be discharged.   Bethel Born, PA-C 01/30/16 1250  Rolland Porter, MD 02/07/16 [phone]

## 2016-01-30 NOTE — Discharge Instructions (Signed)

## 2016-01-30 NOTE — ED Notes (Signed)
Pt reports cough, sore throat and body aches since Thursday.

## 2016-02-01 LAB — CULTURE, GROUP A STREP (THRC)

## 2016-06-20 ENCOUNTER — Emergency Department (HOSPITAL_COMMUNITY): Payer: Medicaid Other

## 2016-06-20 ENCOUNTER — Emergency Department (HOSPITAL_COMMUNITY)
Admission: EM | Admit: 2016-06-20 | Discharge: 2016-06-20 | Disposition: A | Discharge status: Home / Self Care | Payer: Medicaid Other | Attending: Emergency Medicine | Admitting: Emergency Medicine

## 2016-06-20 ENCOUNTER — Encounter (HOSPITAL_COMMUNITY): Payer: Self-pay | Admitting: Emergency Medicine

## 2016-06-20 DIAGNOSIS — R0789 Other chest pain: Secondary | ICD-10-CM | POA: Diagnosis not present

## 2016-06-20 DIAGNOSIS — R112 Nausea with vomiting, unspecified: Secondary | ICD-10-CM

## 2016-06-20 LAB — BASIC METABOLIC PANEL
Anion gap: 7 (ref 5–15)
BUN: 12 mg/dL (ref 6–20)
CO2: 27 mmol/L (ref 22–32)
Calcium: 9.2 mg/dL (ref 8.9–10.3)
Chloride: 104 mmol/L (ref 101–111)
Creatinine, Ser: 0.64 mg/dL (ref 0.44–1.00)
GFR calc Af Amer: >60 mL/min (ref >60)
GLUCOSE: 101 mg/dL — AB (ref 65–99)
POTASSIUM: 3.5 mmol/L (ref 3.5–5.1)
Sodium: 138 mmol/L (ref 135–145)

## 2016-06-20 LAB — CBC
HEMATOCRIT: 39 % (ref 36.0–46.0)
Hemoglobin: 13.2 g/dL (ref 12.0–15.0)
MCH: 31.7 pg (ref 26.0–34.0)
MCHC: 33.8 g/dL (ref 30.0–36.0)
MCV: 93.5 fL (ref 78.0–100.0)
Platelets: 287 10*3/uL (ref 150–400)
RBC: 4.17 MIL/uL (ref 3.87–5.11)
RDW: 11.8 % (ref 11.5–15.5)
WBC: 9.1 10*3/uL (ref 4.0–10.5)

## 2016-06-20 LAB — D-DIMER, QUANTITATIVE: D-Dimer, Quant: <0.27 ug/mL-FEU (ref 0.00–0.50)

## 2016-06-20 LAB — I-STAT TROPONIN, ED
Troponin i, poc: 0 ng/mL (ref 0.00–0.08)
Troponin i, poc: 0 ng/mL (ref 0.00–0.08)

## 2016-06-20 MED ORDER — ONDANSETRON 4 MG PO TBDP: 4.0000 mg | ORAL_TABLET | Freq: Three times a day (TID) | ORAL | 0 refills | Status: DC | PRN

## 2016-06-20 MED ORDER — ONDANSETRON 4 MG PO TBDP
4.0000 mg | ORAL_TABLET | Freq: Once | ORAL | Status: AC
  Administered 2016-06-20: 4 mg via ORAL
  Filled 2016-06-20: qty 1

## 2016-06-20 NOTE — ED Provider Notes (Signed)
Care assumed from [facility], PA-C at end of shift. Briefly, [name] is an 35 y.o. female who presents to the ED for evaluation of chest pain with nausea. Chest pain sounds atypical. She describes pinching pain in her right chest. Workup here thus far has been unrevealing. Pt did have one brief run of sinus tach with another flair of the pain and nausea. Tachycardia, pain, and nausea have again subsided. Dispo is now pending d-dimer and delta troponin. We anticipate d/c home with anti-emetics and PCP f/u.   Physical Exam  BP 147/72   Pulse 74   Temp 98.6 F (37 C) (Oral)   Resp 17   LMP 06/06/2016   SpO2 98%   Physical Exam  Constitutional: She is oriented to person, place, and time. No distress.  HENT:  Head: Atraumatic.  Right Ear: External ear normal.  Left Ear: External ear normal.  Nose: Nose normal.  Eyes: Conjunctivae are normal. No scleral icterus.  Cardiovascular: Normal rate and regular rhythm.   Pulmonary/Chest: Effort normal. No respiratory distress.  Abdominal: She exhibits no distension.  Neurological: She is alert and oriented to person, place, and time.  Skin: Skin is warm and dry. She is not diaphoretic.  Psychiatric: She has a normal mood and affect. Her behavior is normal.  Nursing note and vitals reviewed.   ED Course  Procedures  Results for orders placed or performed during the hospital encounter of 06/20/16  Basic metabolic panel  Result Value Ref Range   Sodium 138 135 - 145 mmol/L   Potassium 3.5 3.5 - 5.1 mmol/L   Chloride 104 101 - 111 mmol/L   CO2 27 22 - 32 mmol/L   Glucose, Bld 101 (H) 65 - 99 mg/dL   BUN 12 6 - 20 mg/dL   Creatinine, Ser 7.35 0.44 - 1.00 mg/dL   Calcium 9.2 8.9 - 32.9 mg/dL   GFR calc non Af Amer >60 >60 mL/min   GFR calc Af Amer >60 >60 mL/min   Anion gap 7 5 - 15  CBC  Result Value Ref Range   WBC 9.1 4.0 - 10.5 K/uL   RBC 4.17 3.87 - 5.11 MIL/uL   Hemoglobin 13.2 12.0 - 15.0 g/dL   HCT 92.4 26.8 -  34.1 %   MCV 93.5 78.0 - 100.0 fL   MCH 31.7 26.0 - 34.0 pg   MCHC 33.8 30.0 - 36.0 g/dL   RDW 96.2 22.9 - 79.8 %   Platelets 287 150 - 400 K/uL  D-dimer, quantitative  Result Value Ref Range   D-Dimer, Quant <0.27 0.00 - 0.50 ug/mL-FEU  I-stat troponin, ED  Result Value Ref Range   Troponin i, poc 0.00 0.00 - 0.08 ng/mL   Comment 3          I-stat troponin, ED  Result Value Ref Range   Troponin i, poc 0.00 0.00 - 0.08 ng/mL   Comment 3           Dg Chest 2 View  Result Date: 06/20/2016 CLINICAL DATA:  Right upper anterior chest pain that radiates up into neck and began earlier today - never a smoker - pt states no other chest hx EXAM: CHEST  2 VIEW COMPARISON:  None. FINDINGS: Midline trachea.  Normal heart size and mediastinal contours. Sharp costophrenic angles.  No pneumothorax.  Clear lungs. Mild right hemidiaphragm elevation. IMPRESSION: No active cardiopulmonary disease. Electronically Signed   By: Jeronimo Greaves M.D.   On: 06/20/2016 13:33  MDM Repeat troponin negative. D-dimer negative. Pt continues to feel much improved and ready to go home. rx given for anti-emetics. Encouraged close PCP f/u. ER return precautions given.      Carlene Coria, PA-C 06/20/16 2038    Mancel Bale, MD 06/27/16 [phone]

## 2016-06-20 NOTE — ED Triage Notes (Signed)
Pt states that she is having right chest pain that radiates down right arm and right neck.  It started approximately 2 hours ago.  Patient also complains of nausea without emesis.

## 2016-06-20 NOTE — ED Provider Notes (Signed)
[facility]-EMERGENCY DEPT Provider Note   CSN: [id number] Arrival date & time: 06/20/16  1201  First Provider Contact:  2:30      History   Chief Complaint Chief Complaint  Patient presents with  . Chest Pain  . Nausea    HPI [name] is a 35 y.o. female here for evaluation of chest discomfort with nausea. Patient reports that approximately 10:00 this morning while at work she felt a sudden onset "pinching pain" in her right chest that radiated into her right arm and neck.. 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 She reports the sensation then migrated to her left chest and arm and then back again to the right. No exertional component. Not better with resting. She reports associated nausea, but no vomiting. No diaphoresis or shortness of breath. However she does report the pain is worse when she takes a deep respiration. No fevers, chills, numbness or weakness, abdominal pain. No other modifying factors. PERC negative. Nonsmoker, no history of family cardiac disease.  HPI  History reviewed. No pertinent past medical history.  There are no active problems to display for this patient.   History reviewed. No pertinent surgical history.  OB History    No data available       Home Medications    Prior to Admission medications   Medication Sig Start Date End Date Taking? Authorizing Provider  ondansetron (ZOFRAN ODT) 4 MG disintegrating tablet Take 1 tablet (4 mg total) by mouth every 8 (eight) hours as needed for nausea or vomiting. 06/20/16   Carlene Coria, PA-C    Family History History reviewed. No pertinent family history.  Social History Social History  Substance Use Topics  . Smoking status: Never Smoker  . Smokeless tobacco: Never Used  . Alcohol use Yes     Comment: occasional     Allergies   Garlic and Onion   Review of Systems Review of Systems A 10 point review of systems was completed and was negative except for pertinent positives and negatives as  mentioned in the history of present illness    Physical Exam Updated Vital Signs BP 93/60 (BP Location: Left Arm)   Pulse 64   Temp 98.6 F (37 C) (Oral)   Resp 18   LMP 06/06/2016   SpO2 100%   Physical Exam  Constitutional: She appears well-developed. No distress.  Awake, alert and nontoxic in appearance  HENT:  Head: Normocephalic and atraumatic.  Right Ear: External ear normal.  Left Ear: External ear normal.  Mouth/Throat: Oropharynx is clear and moist.  Eyes: Conjunctivae and EOM are normal. Pupils are equal, round, and reactive to light.  Neck: Normal range of motion. No JVD present.  Cardiovascular: Normal rate, regular rhythm and normal heart sounds.   Pulmonary/Chest: Effort normal and breath sounds normal. No stridor.  Abdominal: Soft. There is no tenderness.  Musculoskeletal: Normal range of motion. She exhibits no edema.  Neurological:  Awake, alert, cooperative and aware of situation; motor strength bilaterally; sensation normal to light touch bilaterally; no facial asymmetry; tongue midline; major cranial nerves appear intact;  baseline gait without new ataxia.  Skin: No rash noted. She is not diaphoretic.  Psychiatric: She has a normal mood and affect. Her behavior is normal. Thought content normal.  Nursing note and vitals reviewed.    ED Treatments / Results  Labs (all labs ordered are listed, but only abnormal results are displayed) Labs Reviewed  BASIC METABOLIC PANEL - Abnormal; Notable for the following:  Result Value   Glucose, Bld 101 (*)    All other components within normal limits  CBC  D-DIMER, QUANTITATIVE (NOT AT [facility])  I-STAT TROPOININ, ED  I-STAT TROPOININ, ED    EKG  EKG Interpretation  Date/Time:  Tuesday June 20 2016 12:20:04 EDT Ventricular Rate:  86 PR Interval:    QRS Duration: 80 QT Interval:  381 QTC Calculation: 456 R Axis:   67 Text Interpretation:  Sinus rhythm Right atrial enlargement No old tracing to compare  Confirmed by [facility]  MD, Nicholos Johns [phone]) on 06/20/2016 2:14:46 PM       Radiology Dg Chest 2 View  Result Date: 06/20/2016 CLINICAL DATA:  Right upper anterior chest pain that radiates up into neck and began earlier today - never a smoker - pt states no other chest hx EXAM: CHEST  2 VIEW COMPARISON:  None. FINDINGS: Midline trachea.  Normal heart size and mediastinal contours. Sharp costophrenic angles.  No pneumothorax.  Clear lungs. Mild right hemidiaphragm elevation. IMPRESSION: No active cardiopulmonary disease. Electronically Signed   By: Jeronimo Greaves M.D.   On: 06/20/2016 13:33   Procedures Procedures (including critical care time)  Medications Ordered in ED Medications  ondansetron (ZOFRAN-ODT) disintegrating tablet 4 mg (4 mg Oral Given 06/20/16 1614)     Initial Impression / Assessment and Plan / ED Course  I have reviewed the triage vital signs and the nursing notes.  Pertinent labs & imaging results that were available during my care of the patient were reviewed by me and considered in my medical decision making (see chart for details).  Clinical Course    Patient presents with atypical chest pain, pinching in nature and migrating. Low suspicion for ACS or other emergent cardiopulmonary pathology. Heart score 1, PERC negative. EKG reassuring as are screening labs and chest x-ray. Patient maintains mild pleuritic discomfort in ED with deep respiration. Will plan for delta troponin. Patient care signed out to oncoming provider, Sam PA-C, for follow-up on troponin, reevaluation and subsequent disposition. If negative, I anticipate patient may be discharged home to follow-up with PCP and cardiology on an outpatient basis. 15:30-Upon reassessment of patient, reports she had another episode of right-sided sharp chest pain with nausea, experienced subsequent episode of sinus tachycardia. We'll add a d-dimer.  Final Clinical Impressions(s) / ED Diagnoses   Final diagnoses:    Atypical chest pain  Non-intractable vomiting with nausea, vomiting of unspecified type    New Prescriptions Discharge Medication List as of 06/20/2016  6:08 PM    START taking these medications   Details  ondansetron (ZOFRAN ODT) 4 MG disintegrating tablet Take 1 tablet (4 mg total) by mouth every 8 (eight) hours as needed for nausea or vomiting., Starting Tue 06/20/2016, Print         Joycie Peek, PA-C 06/21/16 6045    Samuel Jester, DO 06/23/16 2125

## 2016-06-20 NOTE — Discharge Instructions (Signed)
Your labs today were normal. Take Zofran as needed for nausea and vomiting. Follow up with your primary care provider. If you do not have one please call the phone number in this packet to get help establishing one.

## 2016-06-20 NOTE — ED Notes (Signed)
Attempted 2 sticks, both were unsuccessful, RN made aware.

## 2016-09-12 ENCOUNTER — Emergency Department (HOSPITAL_COMMUNITY)
Admission: EM | Admit: 2016-09-12 | Discharge: 2016-09-12 | Disposition: A | Discharge status: Home / Self Care | Payer: No Typology Code available for payment source | Attending: Emergency Medicine | Admitting: Emergency Medicine

## 2016-09-12 ENCOUNTER — Encounter (HOSPITAL_COMMUNITY): Payer: Self-pay | Admitting: *Deleted

## 2016-09-12 DIAGNOSIS — Y999 Unspecified external cause status: Secondary | ICD-10-CM | POA: Insufficient documentation

## 2016-09-12 DIAGNOSIS — M545 Low back pain: Secondary | ICD-10-CM | POA: Insufficient documentation

## 2016-09-12 DIAGNOSIS — R42 Dizziness and giddiness: Secondary | ICD-10-CM | POA: Diagnosis not present

## 2016-09-12 DIAGNOSIS — Y9389 Activity, other specified: Secondary | ICD-10-CM | POA: Insufficient documentation

## 2016-09-12 DIAGNOSIS — Y92410 Unspecified street and highway as the place of occurrence of the external cause: Secondary | ICD-10-CM | POA: Diagnosis not present

## 2016-09-12 MED ORDER — CYCLOBENZAPRINE HCL 10 MG PO TABS: 10.0000 mg | ORAL_TABLET | Freq: Two times a day (BID) | ORAL | 0 refills | Status: DC | PRN

## 2016-09-12 MED ORDER — IBUPROFEN 400 MG PO TABS: 400.0000 mg | ORAL_TABLET | Freq: Four times a day (QID) | ORAL | 0 refills | Status: DC | PRN
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
## 2016-12-17 ENCOUNTER — Emergency Department (HOSPITAL_COMMUNITY): Payer: BLUE CROSS/BLUE SHIELD

## 2016-12-17 ENCOUNTER — Encounter (HOSPITAL_COMMUNITY): Payer: Self-pay | Admitting: Oncology

## 2016-12-17 ENCOUNTER — Emergency Department (HOSPITAL_COMMUNITY)
Admission: EM | Admit: 2016-12-17 | Discharge: 2016-12-18 | Disposition: A | Discharge status: Home / Self Care | Payer: BLUE CROSS/BLUE SHIELD | Attending: Emergency Medicine | Admitting: Emergency Medicine

## 2016-12-17 DIAGNOSIS — A084 Viral intestinal infection, unspecified: Secondary | ICD-10-CM | POA: Insufficient documentation

## 2016-12-17 DIAGNOSIS — F410 Panic disorder [episodic paroxysmal anxiety] without agoraphobia: Secondary | ICD-10-CM

## 2016-12-17 DIAGNOSIS — R0682 Tachypnea, not elsewhere classified: Secondary | ICD-10-CM | POA: Insufficient documentation

## 2016-12-17 DIAGNOSIS — Z79899 Other long term (current) drug therapy: Secondary | ICD-10-CM | POA: Diagnosis not present

## 2016-12-17 DIAGNOSIS — R112 Nausea with vomiting, unspecified: Secondary | ICD-10-CM | POA: Diagnosis present

## 2016-12-17 MED ORDER — SODIUM CHLORIDE 0.9 % IV BOLUS (SEPSIS)
1000.0000 mL | Freq: Once | INTRAVENOUS | Status: AC
  Administered 2016-12-18: 1000 mL via INTRAVENOUS

## 2016-12-17 NOTE — ED Triage Notes (Signed)
Pt presents d/t shob and tachycardia.  Pt is febrile in triage.  Pt also c/o chills, nausea and vomiting.  Pt is A&O x 4.   Speaking in full sentences.

## 2016-12-17 NOTE — ED Provider Notes (Signed)
[facility]-EMERGENCY DEPT Provider Note   CSN: [id number] Arrival date & time: 12/17/16  2149   By signing my name below, I, [name], attest that this documentation has been prepared under the direction and in the presence of Glennice Marcos, MD . Electronically Signed: Teofilo PodMatthew P. Fields, ED Scribe. 12/17/2016. 11:45 PM.   History   Chief Complaint Chief Complaint  Patient presents with  . Shortness of Breath    The history is provided by the patient. No language interpreter was used.  Shortness of Breath  This is a new problem. The current episode started 12 to 24 hours ago. The problem has not changed since onset.Associated symptoms include vomiting. Pertinent negatives include no sore throat, no neck pain, no cough, no sputum production, no hemoptysis and no wheezing. Associated symptoms comments: Panic secondary to vomiting that is when the SOB started and mouth numbness, hyperventilating and numb clammy hands cramping all over secondary to hyperventilation. It is unknown what precipitated the problem. She has tried nothing for the symptoms. The treatment provided no relief. She has had no prior hospitalizations. Associated medical issues do not include asthma, PE, heart failure or DVT.   HPI Comments:  [name] is a 36 y.o. female who presents to the Emergency Department complaining of persistent SOB that began today. Pt complains of associated nausea, vomiting, abdominal pain, back pain, and leg pain. 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  Pt denies having been under any stresses at home or at work. Pt denies diarrhea, cough, congestion, eye itchiness, sore throat, constipation.   History reviewed. No pertinent past medical history.  There are no active problems to display for this patient.   History reviewed. No pertinent surgical  history.  OB History    No data available       Home Medications    Prior to Admission medications   Medication Sig Start Date End Date Taking? Authorizing Provider  cyclobenzaprine (FLEXERIL) 10 MG tablet Take 1 tablet (10 mg total) by mouth 2 (two) times daily as needed for muscle spasms. 09/12/16   Fayrene HelperBowie Tran, PA-C  ibuprofen (ADVIL,MOTRIN) 400 MG tablet Take 1 tablet (400 mg total) by mouth every 6 (six) hours as needed. 09/12/16   Fayrene HelperBowie Tran, PA-C  ondansetron (ZOFRAN ODT) 4 MG disintegrating tablet Take 1 tablet (4 mg total) by mouth every 8 (eight) hours as needed for nausea or vomiting. 06/20/16   Carlene CoriaSerena Y Sam, PA-C    Family History No family history on file.  Social History Social History  Substance Use Topics  . Smoking status: Never Smoker  . Smokeless tobacco: Never Used  . Alcohol use Yes     Comment: occasional     Allergies   Garlic and Onion   Review of Systems Review of Systems  HENT: Negative for congestion and sore throat.   Eyes: Negative for pain.  Respiratory: Negative for cough, hemoptysis, sputum production and wheezing.   Gastrointestinal: Positive for nausea and vomiting. Negative for constipation and diarrhea.  Musculoskeletal: Positive for back pain and myalgias. Negative for neck pain.  Psychiatric/Behavioral: Positive for agitation. The patient is nervous/anxious.   All other systems reviewed and are negative.    Physical Exam Updated Vital Signs BP 119/76   Pulse (!) 126   Temp 100.1 F (37.8 C) (Oral)   Resp 18   Ht 5'  1" (1.549 m)   Wt 140 lb (63.5 kg)   LMP 11/13/2016 (Approximate)   SpO2 100%   BMI 26.45 kg/m   Physical Exam  Constitutional: She is oriented to person, place, and time. She appears well-developed and well-nourished. No distress.  HENT:  Head: Normocephalic and atraumatic.  Mouth/Throat: Oropharynx is clear and moist. No oropharyngeal exudate.  Trachea midline  Eyes: Conjunctivae and EOM are normal.  Pupils are equal, round, and reactive to light.  Neck: Trachea normal and normal range of motion. Neck supple. No JVD present. Carotid bruit is not present.  Cardiovascular: Regular rhythm.  Tachycardia present.  Exam reveals no gallop and no friction rub.   No murmur heard. Tachycardia  Pulmonary/Chest: Effort normal and breath sounds normal. No stridor. Tachypnea noted. She has no wheezes. She has no rales.  Abdominal: Soft. Bowel sounds are normal. She exhibits no mass. There is no tenderness. There is no rebound and no guarding.  Musculoskeletal: Normal range of motion.  Lymphadenopathy:    She has no cervical adenopathy.  Neurological: She is alert and oriented to person, place, and time. She has normal reflexes. She displays normal reflexes. No cranial nerve deficit. She exhibits normal muscle tone. Coordination normal.  Cranial nerves 2-12 intact  Skin: Skin is warm and dry. She is not diaphoretic.  Psychiatric: Her mood appears anxious. Her affect is labile. She is aggressive.  Patient tearful, noticeably upset, and anxious.     ED Treatments / Results   Vitals:   12/18/16 0130 12/18/16 0200  BP: 121/72 114/73  Pulse: 101 96  Resp: 24 24  Temp:     Results for orders placed or performed during the hospital encounter of 12/17/16  CBC with Differential/Platelet  Result Value Ref Range   WBC 14.5 (H) 4.0 - 10.5 K/uL   RBC 4.60 3.87 - 5.11 MIL/uL   Hemoglobin 14.3 12.0 - 15.0 g/dL   HCT 16.1 09.6 - 04.5 %   MCV 92.4 78.0 - 100.0 fL   MCH 31.1 26.0 - 34.0 pg   MCHC 33.6 30.0 - 36.0 g/dL   RDW 40.9 81.1 - 91.4 %   Platelets 263 150 - 400 K/uL   Neutrophils Relative % 81 %   Neutro Abs 11.8 (H) 1.7 - 7.7 K/uL   Lymphocytes Relative 9 %   Lymphs Abs 1.3 0.7 - 4.0 K/uL   Monocytes Relative 10 %   Monocytes Absolute 1.4 (H) 0.1 - 1.0 K/uL   Eosinophils Relative 0 %   Eosinophils Absolute 0.0 0.0 - 0.7 K/uL   Basophils Relative 0 %   Basophils Absolute 0.0 0.0 - 0.1 K/uL   D-dimer, quantitative (not at [facility])  Result Value Ref Range   D-Dimer, Quant 0.47 0.00 - 0.50 ug/mL-FEU  Acetaminophen level  Result Value Ref Range   Acetaminophen (Tylenol), Serum <10 (L) 10 - 30 ug/mL  Salicylate level  Result Value Ref Range   Salicylate Lvl <7.0 2.8 - 30.0 mg/dL  Rapid urine drug screen (hospital performed)  Result Value Ref Range   Opiates NONE DETECTED NONE DETECTED   Cocaine NONE DETECTED NONE DETECTED   Benzodiazepines NONE DETECTED NONE DETECTED   Amphetamines NONE DETECTED NONE DETECTED   Tetrahydrocannabinol NONE DETECTED NONE DETECTED   Barbiturates NONE DETECTED NONE DETECTED  I-Stat Chem 8, ED  Result Value Ref Range   Sodium 137 135 - 145 mmol/L   Potassium 4.3 3.5 - 5.1 mmol/L   Chloride 100 (L) 101 - 111 mmol/L  BUN 14 6 - 20 mg/dL   Creatinine, Ser 1.61 0.44 - 1.00 mg/dL   Glucose, Bld 91 65 - 99 mg/dL   Calcium, Ion 0.96 (L) 1.15 - 1.40 mmol/L   TCO2 29 0 - 100 mmol/L   Hemoglobin 14.3 12.0 - 15.0 g/dL   HCT 04.5 40.9 - 81.1 %  I-stat troponin, ED  Result Value Ref Range   Troponin i, poc 0.00 0.00 - 0.08 ng/mL   Comment 3          I-Stat Beta hCG blood, ED ([facility], [facility], [facility] only)  Result Value Ref Range   I-stat hCG, quantitative <5.0 <5 mIU/mL   Comment 3           Dg Chest 2 View  Result Date: 12/17/2016 CLINICAL DATA:  Chills, fevers, dyspnea and body ache. EXAM: CHEST  2 VIEW COMPARISON:  06/20/2016 CXR FINDINGS: The heart size and mediastinal contours are within normal limits. Both lungs are clear. The visualized skeletal structures are unremarkable. IMPRESSION: No active cardiopulmonary disease. Electronically Signed   By: Tollie Eth M.D.   On: 12/17/2016 23:08    DIAGNOSTIC STUDIES:  Oxygen Saturation is 100% on RA, normal by my interpretation.     Labs  Results for orders placed or performed during the hospital encounter of 12/17/16  CBC with Differential/Platelet  Result Value Ref Range   WBC 14.5 (H) 4.0 - 10.5 K/uL    RBC 4.60 3.87 - 5.11 MIL/uL   Hemoglobin 14.3 12.0 - 15.0 g/dL   HCT 91.4 78.2 - 95.6 %   MCV 92.4 78.0 - 100.0 fL   MCH 31.1 26.0 - 34.0 pg   MCHC 33.6 30.0 - 36.0 g/dL   RDW 21.3 08.6 - 57.8 %   Platelets 263 150 - 400 K/uL   Neutrophils Relative % 81 %   Neutro Abs 11.8 (H) 1.7 - 7.7 K/uL   Lymphocytes Relative 9 %   Lymphs Abs 1.3 0.7 - 4.0 K/uL   Monocytes Relative 10 %   Monocytes Absolute 1.4 (H) 0.1 - 1.0 K/uL   Eosinophils Relative 0 %   Eosinophils Absolute 0.0 0.0 - 0.7 K/uL   Basophils Relative 0 %   Basophils Absolute 0.0 0.0 - 0.1 K/uL  D-dimer, quantitative (not at [facility])  Result Value Ref Range   D-Dimer, Quant 0.47 0.00 - 0.50 ug/mL-FEU  Acetaminophen level  Result Value Ref Range   Acetaminophen (Tylenol), Serum <10 (L) 10 - 30 ug/mL  Salicylate level  Result Value Ref Range   Salicylate Lvl <7.0 2.8 - 30.0 mg/dL  Rapid urine drug screen (hospital performed)  Result Value Ref Range   Opiates NONE DETECTED NONE DETECTED   Cocaine NONE DETECTED NONE DETECTED   Benzodiazepines NONE DETECTED NONE DETECTED   Amphetamines NONE DETECTED NONE DETECTED   Tetrahydrocannabinol NONE DETECTED NONE DETECTED   Barbiturates NONE DETECTED NONE DETECTED  I-Stat Chem 8, ED  Result Value Ref Range   Sodium 137 135 - 145 mmol/L   Potassium 4.3 3.5 - 5.1 mmol/L   Chloride 100 (L) 101 - 111 mmol/L   BUN 14 6 - 20 mg/dL   Creatinine, Ser 4.69 0.44 - 1.00 mg/dL   Glucose, Bld 91 65 - 99 mg/dL   Calcium, Ion 6.29 (L) 1.15 - 1.40 mmol/L   TCO2 29 0 - 100 mmol/L   Hemoglobin 14.3 12.0 - 15.0 g/dL   HCT 52.8 41.3 - 24.4 %  I-stat troponin, ED  Result Value Ref  Range   Troponin i, poc 0.00 0.00 - 0.08 ng/mL   Comment 3          I-Stat Beta hCG blood, ED ([facility], [facility], [facility] only)  Result Value Ref Range   I-stat hCG, quantitative <5.0 <5 mIU/mL   Comment 3           Dg Chest 2 View  Result Date: 12/17/2016 CLINICAL DATA:  Chills, fevers, dyspnea and body ache. EXAM: CHEST  2 VIEW  COMPARISON:  06/20/2016 CXR FINDINGS: The heart size and mediastinal contours are within normal limits. Both lungs are clear. The visualized skeletal structures are unremarkable. IMPRESSION: No active cardiopulmonary disease. Electronically Signed   By: Tollie Eth M.D.   On: 12/17/2016 23:08    EKG  EKG Interpretation  Date/Time:  Sunday December 17 2016 21:57:21 EST Ventricular Rate:  129 PR Interval:    QRS Duration: 75 QT Interval:  271 QTC Calculation: 397 R Axis:   63 Text Interpretation:  Sinus tachycardia Confirmed by [facility]  MD, Shawnese Magner ([id number]) on 12/17/2016 11:32:22 PM       Radiology Dg Chest 2 View  Result Date: 12/17/2016 CLINICAL DATA:  Chills, fevers, dyspnea and body ache. EXAM: CHEST  2 VIEW COMPARISON:  06/20/2016 CXR FINDINGS: The heart size and mediastinal contours are within normal limits. Both lungs are clear. The visualized skeletal structures are unremarkable. IMPRESSION: No active cardiopulmonary disease. Electronically Signed   By: Tollie Eth M.D.   On: 12/17/2016 23:08    Procedures Procedures (including critical care time)  Medications Ordered in ED  Medications  sodium chloride 0.9 % bolus 1,000 mL (0 mLs Intravenous Stopped 12/18/16 0210)  ketorolac (TORADOL) 30 MG/ML injection 30 mg (30 mg Intravenous Given 12/18/16 0211)  ondansetron (ZOFRAN) injection 4 mg (4 mg Intravenous Given 12/18/16 0211)    Patient became angry and aggressive with examined and stated while screaming "whay are you people asking me questions"  HR shot up and then patient made growling noise and shut down.     Cleared medically by [name].  This is clear a viral infection with superimposed panic attack.  Given patient's aggressive behavior a consult was made to TTS for medical clearance.  They state that she is not a danger to herself or others and can be discharged.     Final Clinical Impressions(s) / ED Diagnoses  Viral gastroenteritis with superimposed panic attack.   Ruled out for PE with negative ddimer in a low risk patient.  All questions answered to patient's satisfaction. Based on history and exam patient has been appropriately medically screened and emergency conditions excluded. Patient is stable for discharge at this time. Strict return precautions given for any further episodes, persistent fever, weakness or any concerns.  I personally performed the services described in this documentation, which was scribed in my presence. The recorded information has been reviewed and is accurate.      Cy Blamer, MD 12/18/16 [id number]

## 2016-12-18 ENCOUNTER — Encounter (HOSPITAL_COMMUNITY): Payer: Self-pay | Admitting: Emergency Medicine

## 2016-12-18 LAB — CBC WITH DIFFERENTIAL/PLATELET
Basophils Absolute: 0 10*3/uL (ref 0.0–0.1)
Basophils Relative: 0 %
Eosinophils Absolute: 0 10*3/uL (ref 0.0–0.7)
Eosinophils Relative: 0 %
HCT: 42.5 % (ref 36.0–46.0)
HEMOGLOBIN: 14.3 g/dL (ref 12.0–15.0)
LYMPHS ABS: 1.3 10*3/uL (ref 0.7–4.0)
LYMPHS PCT: 9 %
MCH: 31.1 pg (ref 26.0–34.0)
MCHC: 33.6 g/dL (ref 30.0–36.0)
MCV: 92.4 fL (ref 78.0–100.0)
Monocytes Absolute: 1.4 10*3/uL — ABNORMAL HIGH (ref 0.1–1.0)
Monocytes Relative: 10 %
NEUTROS PCT: 81 %
Neutro Abs: 11.8 10*3/uL — ABNORMAL HIGH (ref 1.7–7.7)
Platelets: 263 10*3/uL (ref 150–400)
RBC: 4.6 MIL/uL (ref 3.87–5.11)
RDW: 11.9 % (ref 11.5–15.5)
WBC: 14.5 10*3/uL — AB (ref 4.0–10.5)

## 2016-12-18 LAB — I-STAT CHEM 8, ED
BUN: 14 mg/dL (ref 6–20)
CALCIUM ION: 1.08 mmol/L — AB (ref 1.15–1.40)
CHLORIDE: 100 mmol/L — AB (ref 101–111)
Creatinine, Ser: 0.8 mg/dL (ref 0.44–1.00)
Glucose, Bld: 91 mg/dL (ref 65–99)
HCT: 42 % (ref 36.0–46.0)
Hemoglobin: 14.3 g/dL (ref 12.0–15.0)
Potassium: 4.3 mmol/L (ref 3.5–5.1)
SODIUM: 137 mmol/L (ref 135–145)
TCO2: 29 mmol/L (ref 0–100)

## 2016-12-18 LAB — RAPID URINE DRUG SCREEN, HOSP PERFORMED
Amphetamines: NOT DETECTED
BARBITURATES: NOT DETECTED
Benzodiazepines: NOT DETECTED
COCAINE: NOT DETECTED
Opiates: NOT DETECTED
Tetrahydrocannabinol: NOT DETECTED

## 2016-12-18 LAB — I-STAT BETA HCG BLOOD, ED (MC, WL, AP ONLY): I-stat hCG, quantitative: <5 m[IU]/mL (ref <5)

## 2016-12-18 LAB — D-DIMER, QUANTITATIVE: D-Dimer, Quant: 0.47 ug/mL-FEU (ref 0.00–0.50)

## 2016-12-18 LAB — I-STAT TROPONIN, ED: Troponin i, poc: 0 ng/mL (ref 0.00–0.08)

## 2016-12-18 LAB — ACETAMINOPHEN LEVEL

## 2016-12-18 LAB — SALICYLATE LEVEL: Salicylate Lvl: <7 mg/dL (ref 2.8–30.0)

## 2016-12-18 MED ORDER — KETOROLAC TROMETHAMINE 30 MG/ML IJ SOLN
30.0000 mg | Freq: Once | INTRAMUSCULAR | Status: AC
  Administered 2016-12-18: 30 mg via INTRAVENOUS
  Filled 2016-12-18: qty 1

## 2016-12-18 MED ORDER — ONDANSETRON HCL 4 MG/2ML IJ SOLN
4.0000 mg | Freq: Once | INTRAMUSCULAR | Status: AC
  Administered 2016-12-18: 4 mg via INTRAVENOUS
  Filled 2016-12-18: qty 2

## 2016-12-18 MED ORDER — ONDANSETRON 8 MG PO TBDP: ORAL_TABLET | ORAL | 0 refills | Status: DC

## 2016-12-18 MED ORDER — DICYCLOMINE HCL 20 MG PO TABS: 20.0000 mg | ORAL_TABLET | Freq: Two times a day (BID) | ORAL | 0 refills | Status: DC

## 2016-12-18 NOTE — ED Notes (Signed)
Delsa GranaRn Lindsey to draw labs

## 2016-12-18 NOTE — BH Assessment (Signed)
Tele Assessment Note   [name] is an 36 y.o. female who presents to the ED due to medical concerns. The [name] reports that while the pt was being assessed, the "Patient became angry and aggressive with examined and stated while screaming "why are you people asking me questions"  HR shot up and then patient made growling noise and shut down." Assessor spoke with the [name] who reports she was concerned for the pt's safety due to her explosive reaction. While speaking with the pt she presented to be in a calm and pleasant demeanor. Pt was smiling and appeared cooperative during the assessment. Pt's fiance' was also in the room during the assessment and he reports that he feels the pt was upset and frustrated due to her back hurting and became irate with the [name]. Pt denies any SI or HI.    Per Beth Conn, FNP pt does not meet criteria for inpt treatment. Beth Palumbo, MD is in agreement with the pt being d/c to return home upon completion of medical treatment.    Diagnosis: Deferred  Past Medical History: History reviewed. No pertinent past medical history.  History reviewed. No pertinent surgical history.  Family History: No family history on file.  Social History:  reports that she has never smoked. She has never used smokeless tobacco. She reports that she drinks alcohol. She reports that she does not use drugs.  Additional Social History:  Alcohol / Drug Use Pain Medications: See PTA meds  Prescriptions: See PTA meds  Over the Counter: See PTA meds  History of alcohol / drug use?: No history of alcohol / drug abuse  CIWA: CIWA-Ar BP: 114/73 Pulse Rate: 96 COWS:    PATIENT STRENGTHS: (choose at least two) Average or above average intelligence Capable of independent living Communication skills Financial means General fund of knowledge Supportive family/friends  Allergies:  Allergies  Allergen Reactions   Garlic Nausea And Vomiting   Onion Nausea And Vomiting     Home Medications:  (Not in a hospital admission)  OB/GYN Status:  Patient's last menstrual period was 11/13/2016 (approximate).  General Assessment Data Location of Assessment: [facility] ED TTS Assessment: In system Is this a Tele or Face-to-Face Assessment?: Face-to-Face Is this an Initial Assessment or a Re-assessment for this encounter?: Initial Assessment Marital status: Long term relationship Is patient pregnant?: No Pregnancy Status: No Living Arrangements: Spouse/significant other (fiance') Can pt return to current living arrangement?: Yes Admission Status: Voluntary Is patient capable of signing voluntary admission?: Yes Referral Source: Self/Family/Friend Insurance type: Medicaid     Crisis Care Plan Living Arrangements: Spouse/significant other (fiance') Name of Psychiatrist: none Name of Therapist: none  Education Status Is patient currently in school?: No Highest grade of school patient has completed: Associates Degree  Risk to self with the past 6 months Suicidal Ideation: No Has patient been a risk to self within the past 6 months prior to admission? : No Suicidal Intent: No Has patient had any suicidal intent within the past 6 months prior to admission? : No Is patient at risk for suicide?: No Suicidal Plan?: No Has patient had any suicidal plan within the past 6 months prior to admission? : No Access to Means: No What has been your use of drugs/alcohol within the last 12 months?: denies Previous Attempts/Gestures: No Triggers for Past Attempts: None known Intentional Self Injurious Behavior: None Family Suicide History: Yes (brother committed suicide in 2016) Recent stressful life event(s): Trauma (Comment) (pt reports she has been sick and  nauseous for several days ) Persecutory voices/beliefs?: No Depression: No Substance abuse history and/or treatment for substance abuse?: No Suicide prevention information given to non-admitted patients: Not  applicable  Risk to Others within the past 6 months Homicidal Ideation: No Does patient have any lifetime risk of violence toward others beyond the six months prior to admission? : No Thoughts of Harm to Others: No Current Homicidal Intent: No Current Homicidal Plan: No Access to Homicidal Means: No History of harm to others?: No Assessment of Violence: None Noted Does patient have access to weapons?: No Criminal Charges Pending?: No Does patient have a court date: No Is patient on probation?: No  Psychosis Hallucinations: None noted Delusions: None noted  Mental Status Report Appearance/Hygiene: Unremarkable Eye Contact: Fair Motor Activity: Freedom of movement, Unremarkable Speech: Logical/coherent Level of Consciousness: Alert Mood: Euthymic Affect: Appropriate to circumstance Anxiety Level: Minimal Thought Processes: Coherent, Relevant Judgement: Unimpaired Orientation: Person, Place, Time, Situation, Appropriate for developmental age Obsessive Compulsive Thoughts/Behaviors: None  Cognitive Functioning Concentration: Normal Memory: Recent Intact, Remote Intact IQ: Average Insight: Good Impulse Control: Fair Appetite: Good Sleep: No Change Total Hours of Sleep: 8 Vegetative Symptoms: None  ADLScreening [facility] Assessment Services) Patient's cognitive ability adequate to safely complete daily activities?: Yes Patient able to express need for assistance with ADLs?: Yes Independently performs ADLs?: Yes (appropriate for developmental age)  Prior Inpatient Therapy Prior Inpatient Therapy: No  Prior Outpatient Therapy Prior Outpatient Therapy: No Does patient have an ACCT team?: No Does patient have Intensive In-House Services?  : No Does patient have [facility] services? : No Does patient have P4CC services?: No  ADL Screening (condition at time of admission) Patient's cognitive ability adequate to safely complete daily activities?: Yes Is the patient deaf or  have difficulty hearing?: No Does the patient have difficulty seeing, even when wearing glasses/contacts?: No Does the patient have difficulty concentrating, remembering, or making decisions?: No Patient able to express need for assistance with ADLs?: Yes Does the patient have difficulty dressing or bathing?: No Independently performs ADLs?: Yes (appropriate for developmental age) Does the patient have difficulty walking or climbing stairs?: No Weakness of Legs: None Weakness of Arms/Hands: None  Home Assistive Devices/Equipment Home Assistive Devices/Equipment: None    Abuse/Neglect Assessment (Assessment to be complete while patient is alone) Physical Abuse: Denies Verbal Abuse: Denies Sexual Abuse: Denies Exploitation of patient/patient's resources: Denies Self-Neglect: Denies     Merchant navy officerAdvance Directives (For Healthcare) Does Patient Have a Medical Advance Directive?: No Would patient like information on creating a medical advance directive?: No - Patient declined    Additional Information 1:1 In Past 12 Months?: No CIRT Risk: No Elopement Risk: No Does patient have medical clearance?: Yes     Disposition:  Disposition Initial Assessment Completed for this Encounter: Yes Disposition of Patient: Other dispositions Other disposition(s): Other (Comment) (d/c per Beth ConnJason Berry, FNP )  Karolee OhsAquicha R Duff 12/18/2016 2:25 AM

## 2016-12-18 NOTE — ED Notes (Signed)
Pt states that after walking, she felt better.

## 2017-08-31 ENCOUNTER — Encounter (HOSPITAL_COMMUNITY): Payer: Self-pay | Admitting: *Deleted

## 2017-08-31 ENCOUNTER — Emergency Department (HOSPITAL_COMMUNITY)
Admission: EM | Admit: 2017-08-31 | Discharge: 2017-08-31 | Disposition: A | Discharge status: Home / Self Care | Payer: No Typology Code available for payment source | Attending: Emergency Medicine | Admitting: Emergency Medicine

## 2017-08-31 DIAGNOSIS — Y92410 Unspecified street and highway as the place of occurrence of the external cause: Secondary | ICD-10-CM | POA: Diagnosis not present

## 2017-08-31 DIAGNOSIS — Y9389 Activity, other specified: Secondary | ICD-10-CM | POA: Insufficient documentation

## 2017-08-31 DIAGNOSIS — S161XXA Strain of muscle, fascia and tendon at neck level, initial encounter: Secondary | ICD-10-CM | POA: Diagnosis not present

## 2017-08-31 DIAGNOSIS — Y998 Other external cause status: Secondary | ICD-10-CM | POA: Insufficient documentation

## 2017-08-31 DIAGNOSIS — S199XXA Unspecified injury of neck, initial encounter: Secondary | ICD-10-CM | POA: Diagnosis present

## 2017-08-31 MED ORDER — CYCLOBENZAPRINE HCL 10 MG PO TABS: 10.0000 mg | ORAL_TABLET | Freq: Two times a day (BID) | ORAL | 0 refills | Status: DC | PRN

## 2017-08-31 MED ORDER — IBUPROFEN 800 MG PO TABS: 800.0000 mg | ORAL_TABLET | Freq: Three times a day (TID) | ORAL | 0 refills | Status: DC

## 2017-08-31 MED ORDER — IBUPROFEN 800 MG PO TABS
800.0000 mg | ORAL_TABLET | Freq: Once | ORAL | Status: AC
  Administered 2017-08-31: 800 mg via ORAL
  Filled 2017-08-31: qty 1

## 2017-08-31 MED ORDER — CYCLOBENZAPRINE HCL 10 MG PO TABS
10.0000 mg | ORAL_TABLET | Freq: Once | ORAL | Status: AC
  Administered 2017-08-31: 10 mg via ORAL
  Filled 2017-08-31: qty 1

## 2017-08-31 NOTE — ED Notes (Signed)
Bed: WTR7 Expected date:  Expected time:  Means of arrival:  Comments: 
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
## 2019-08-06 ENCOUNTER — Other Ambulatory Visit: Payer: Self-pay

## 2019-08-06 ENCOUNTER — Encounter (HOSPITAL_COMMUNITY): Payer: Self-pay

## 2019-08-06 ENCOUNTER — Emergency Department (HOSPITAL_COMMUNITY)
Admission: EM | Admit: 2019-08-06 | Discharge: 2019-08-06 | Disposition: A | Discharge status: Home / Self Care | Payer: Self-pay | Attending: Emergency Medicine | Admitting: Emergency Medicine

## 2019-08-06 ENCOUNTER — Emergency Department (HOSPITAL_COMMUNITY): Payer: Self-pay

## 2019-08-06 DIAGNOSIS — R002 Palpitations: Secondary | ICD-10-CM | POA: Insufficient documentation

## 2019-08-06 LAB — TSH: TSH: 3.983 u[IU]/mL (ref 0.350–4.500)

## 2019-08-06 LAB — TROPONIN I (HIGH SENSITIVITY)
Troponin I (High Sensitivity): <2 ng/L (ref <18)
Troponin I (High Sensitivity): 2 ng/L (ref <18)

## 2019-08-06 LAB — I-STAT BETA HCG BLOOD, ED (NOT ORDERABLE): I-stat hCG, quantitative: <5 m[IU]/mL (ref <5)

## 2019-08-06 LAB — CBC
HCT: 44.1 % (ref 36.0–46.0)
Hemoglobin: 14.3 g/dL (ref 12.0–15.0)
MCH: 31 pg (ref 26.0–34.0)
MCHC: 32.4 g/dL (ref 30.0–36.0)
MCV: 95.5 fL (ref 80.0–100.0)
Platelets: 339 10*3/uL (ref 150–400)
RBC: 4.62 MIL/uL (ref 3.87–5.11)
RDW: 11.9 % (ref 11.5–15.5)
WBC: 14.8 10*3/uL — ABNORMAL HIGH (ref 4.0–10.5)
nRBC: 0 % (ref 0.0–0.2)

## 2019-08-06 LAB — BASIC METABOLIC PANEL
Anion gap: 8 (ref 5–15)
BUN: 11 mg/dL (ref 6–20)
CO2: 26 mmol/L (ref 22–32)
Calcium: 9.4 mg/dL (ref 8.9–10.3)
Chloride: 101 mmol/L (ref 98–111)
Creatinine, Ser: 0.87 mg/dL (ref 0.44–1.00)
GFR calc Af Amer: >60 mL/min (ref >60)
GFR calc non Af Amer: >60 mL/min (ref >60)
Glucose, Bld: 141 mg/dL — ABNORMAL HIGH (ref 70–99)
Potassium: 3.3 mmol/L — ABNORMAL LOW (ref 3.5–5.1)
Sodium: 135 mmol/L (ref 135–145)

## 2019-08-06 LAB — D-DIMER, QUANTITATIVE: D-Dimer, Quant: 0.28 ug/mL-FEU (ref 0.00–0.50)

## 2019-08-06 LAB — MAGNESIUM: Magnesium: 2.4 mg/dL (ref 1.7–2.4)

## 2019-08-06 MED ORDER — SODIUM CHLORIDE 0.9% FLUSH: 3.0000 mL | Freq: Once | INTRAVENOUS | Status: DC

## 2019-08-06 NOTE — ED Triage Notes (Addendum)
Pt c/o intermittent tachycardia since last night.  Currently, denies pain. Sts L posterior shoulder pain, SOB, and "uneasiness" in L chest last night.  Sts symptoms started while she was at work.  Denies changes to diet or medication.   Pt's heart rate stayed between 110 and 120 during triage.  HR dropped to 100 while distracted by phone.

## 2019-08-06 NOTE — ED Provider Notes (Signed)
[facility] DEPT Provider Note   CSN: [id number] Arrival date & time: 08/06/19  1152     History   Chief Complaint Chief Complaint  Patient presents with  . Tachycardia    HPI [name] is a 38 y.o. female.     HPI Patient states that yesterday evening at 11 PM while at work she had acute onset of palpitations.  Described feeling uneasy and dyspnea especially with minor exertion.  Had some initial chest pain in the left side which is now resolved.  States that her heart rate was up to the 130s.  Did improve while she was waiting in triage but states that she had recurrence of her palpitations while walking back to her room.  No recent extended travel or immobilization.  No personal or family history of thromboembolic disease.  No recent weight loss. History reviewed. No pertinent past medical history.  There are no active problems to display for this patient.   History reviewed. No pertinent surgical history.   OB History   No obstetric history on file.      Home Medications    Prior to Admission medications   Medication Sig Start Date End Date Taking? Authorizing Provider  cyclobenzaprine (FLEXERIL) 10 MG tablet Take 1 tablet (10 mg total) by mouth 2 (two) times daily as needed for muscle spasms. Patient not taking: Reported on 08/06/2019 08/31/17   McDonald, Maree Erie A, PA-C  ibuprofen (ADVIL,MOTRIN) 800 MG tablet Take 1 tablet (800 mg total) by mouth 3 (three) times daily. Patient not taking: Reported on 08/06/2019 08/31/17   Joanne Gavel, PA-C    Family History No family history on file.  Social History Social History   Tobacco Use  . Smoking status: Never Smoker  . Smokeless tobacco: Never Used  Substance Use Topics  . Alcohol use: Yes    Comment: occasional  . Drug use: No     Allergies   Garlic and Onion   Review of Systems Review of Systems  Constitutional: Negative for chills and fever.  HENT: Negative for sore  throat and trouble swallowing.   Eyes: Negative for visual disturbance.  Respiratory: Positive for shortness of breath. Negative for cough and wheezing.   Cardiovascular: Positive for chest pain and palpitations. Negative for leg swelling.  Gastrointestinal: Negative for abdominal pain, constipation, diarrhea, nausea and vomiting.  Genitourinary: Negative for dysuria, flank pain and frequency.  Musculoskeletal: Positive for myalgias. Negative for neck pain.  Skin: Negative for rash and wound.  Neurological: Negative for dizziness, syncope, weakness, light-headedness, numbness and headaches.  Psychiatric/Behavioral: The patient is nervous/anxious.   All other systems reviewed and are negative.    Physical Exam Updated Vital Signs BP (!) 165/95 (BP Location: Right Arm)   Pulse 68   Temp 98.4 F (36.9 C) (Oral)   Resp 18   LMP 07/09/2019   SpO2 99%   Physical Exam Vitals signs and nursing note reviewed.  Constitutional:      Appearance: Normal appearance. She is well-developed.  HENT:     Head: Normocephalic and atraumatic.     Nose: Nose normal.     Mouth/Throat:     Mouth: Mucous membranes are moist.  Eyes:     Extraocular Movements: Extraocular movements intact.     Pupils: Pupils are equal, round, and reactive to light.  Neck:     Musculoskeletal: Normal range of motion and neck supple. No neck rigidity or muscular tenderness.     Vascular: No  carotid bruit.  Cardiovascular:     Rate and Rhythm: Normal rate and regular rhythm.     Heart sounds: No murmur. No friction rub. No gallop.   Pulmonary:     Effort: Pulmonary effort is normal. No respiratory distress.     Breath sounds: Normal breath sounds. No stridor. No wheezing, rhonchi or rales.  Chest:     Chest wall: No tenderness.  Abdominal:     General: Bowel sounds are normal.     Palpations: Abdomen is soft.     Tenderness: There is no abdominal tenderness. There is no guarding or rebound.  Musculoskeletal:  Normal range of motion.        General: No swelling, tenderness, deformity or signs of injury.     Right lower leg: No edema.     Left lower leg: No edema.  Lymphadenopathy:     Cervical: No cervical adenopathy.  Skin:    General: Skin is warm and dry.     Capillary Refill: Capillary refill takes less than 2 seconds.     Findings: No erythema or rash.  Neurological:     General: No focal deficit present.     Mental Status: She is alert and oriented to person, place, and time.  Psychiatric:        Mood and Affect: Mood normal.        Behavior: Behavior normal.      ED Treatments / Results  Labs (all labs ordered are listed, but only abnormal results are displayed) Labs Reviewed  BASIC METABOLIC PANEL - Abnormal; Notable for the following components:      Result Value   Potassium 3.3 (*)    Glucose, Bld 141 (*)    All other components within normal limits  CBC - Abnormal; Notable for the following components:   WBC 14.8 (*)    All other components within normal limits  TSH  D-DIMER, QUANTITATIVE (NOT AT [facility])  MAGNESIUM  [facility]STAT [facility]A [facility] ([facility], [facility], [facility] ONLY)  I-STAT BETA HCG BLOOD, ED (NOT ORDERABLE)  TROPONIN I (HIGH SENSITIVITY)  TROPONIN I (HIGH SENSITIVITY)    EKG EKG Interpretation  Date/Time:  Wednesday August 06 2019 12:34:30 EDT Ventricular Rate:  118 PR Interval:    QRS Duration: 85 QT Interval:  321 QTC Calculation: 450 R Axis:   39 Text Interpretation:  Sinus tachycardia Ventricular trigeminy Biatrial enlargement Borderline T wave abnormalities Confirmed by Loren RacerYelverton, Itzel Lowrimore ([id number]) on 08/06/2019 4:33:09 PM   Radiology Dg Chest 2 View  Result Date: 08/06/2019 CLINICAL DATA:  Chest pain, tachycardia EXAM: CHEST - 2 VIEW COMPARISON:  12/17/2016 FINDINGS: The heart size and mediastinal contours are within normal limits. Both lungs are clear. The visualized skeletal structures are unremarkable. IMPRESSION: No acute abnormality of the lungs.  Electronically Signed   By: Lauralyn PrimesAlex  Bibbey M.D.   On: 08/06/2019 12:55    Procedures Procedures (including critical care time)  Medications Ordered in ED Medications  sodium chloride flush (NS) 0.9 % injection 3 mL (has no administration in time range)     Initial Impression / Assessment and Plan / ED Course  I have reviewed the triage vital signs and the nursing notes.  Pertinent labs & imaging results that were available during my care of the patient were reviewed by me and considered in my medical decision making (see chart for details).        Patient's heart rate is within normal range.  She is asymptomatic.  D-dimer and  TSH are normal.  Tachycardia appears to be sinus tachycardia.  We will give outpatient follow-up with cardiology.  Advised to avoid stimulants including caffeine and energy drinks.  Return precautions given. Final Clinical Impressions(s) / ED Diagnoses   Final diagnoses:  Palpitations    ED Discharge Orders    None       Loren Racer, MD 08/06/19 1839

## 2021-01-14 IMAGING — CR DG CHEST 2V
2 series · 2 of 2 positions shown · non-contrast
Comparison: 12/17/2016

CLINICAL DATA: Chest pain, tachycardia

EXAM:
CHEST - 2 VIEW

[w chest pa]
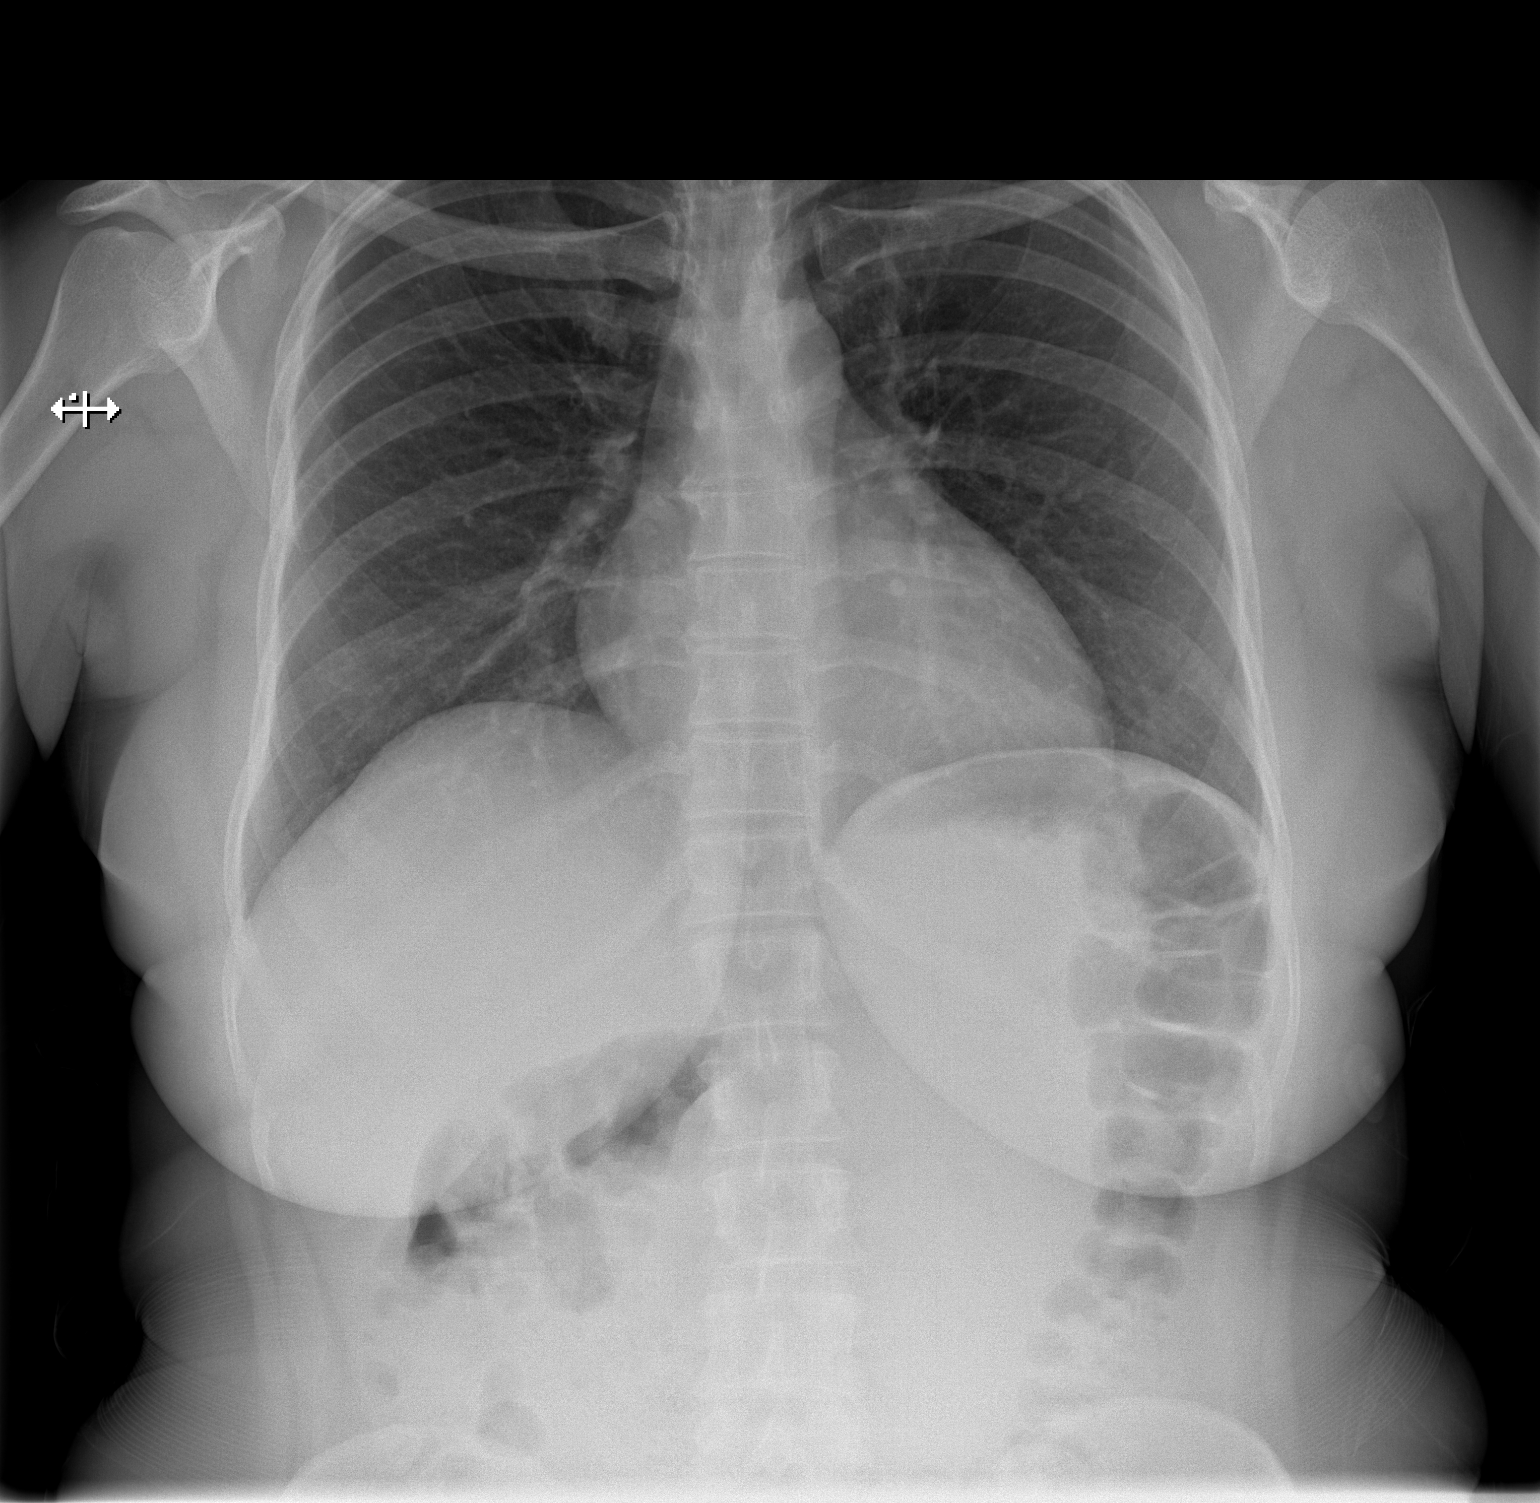

[w chest lat]
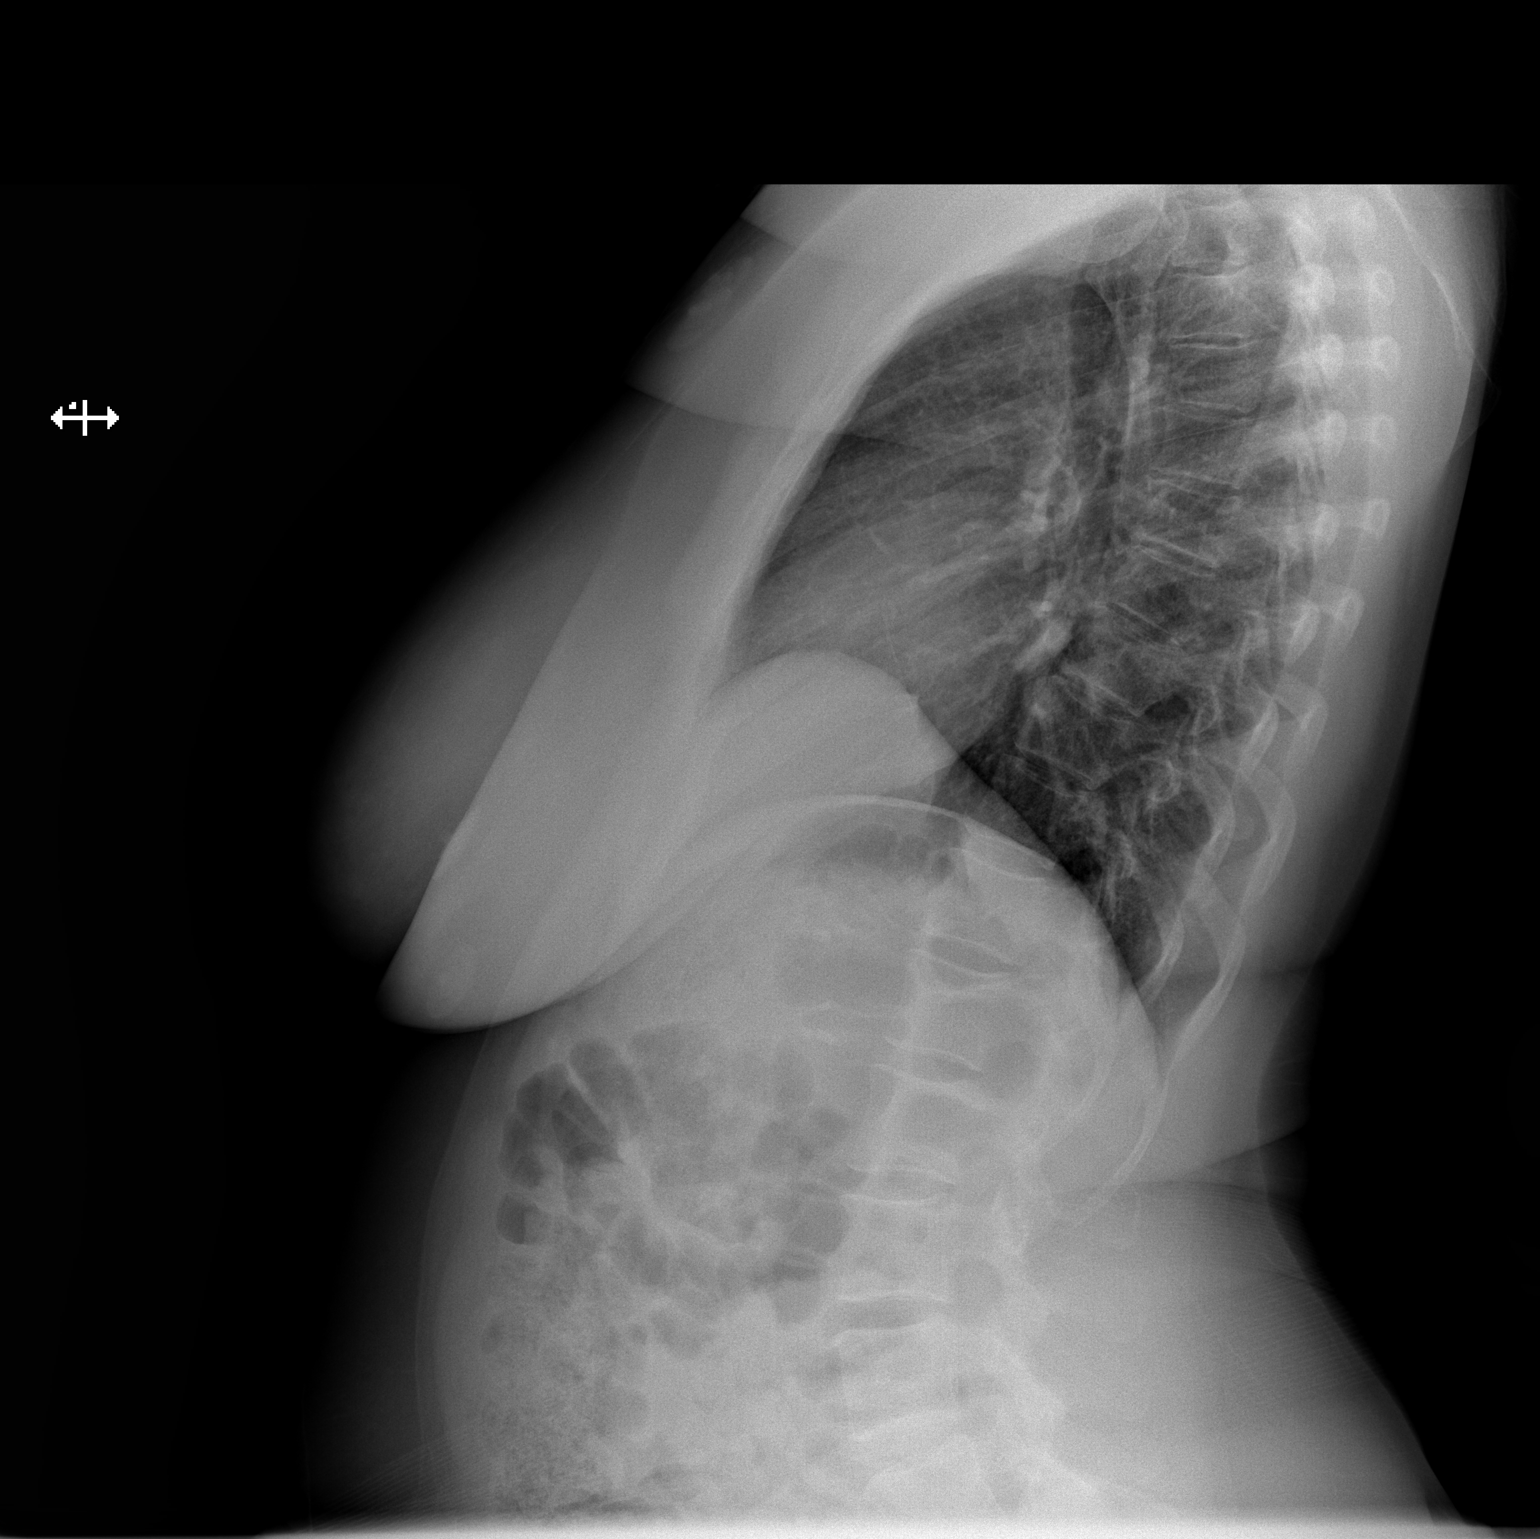

[2 of 2 positions shown; findings below may reference images not displayed]

FINDINGS: The heart size and mediastinal contours are within normal limits.
Both lungs are clear. The visualized skeletal structures are
unremarkable.
IMPRESSION: No acute abnormality of the lungs.

## 2022-01-26 ENCOUNTER — Encounter: Payer: Self-pay | Admitting: Nurse Practitioner

## 2022-01-26 ENCOUNTER — Other Ambulatory Visit: Payer: Self-pay

## 2022-01-26 ENCOUNTER — Ambulatory Visit (INDEPENDENT_AMBULATORY_CARE_PROVIDER_SITE_OTHER): Payer: BC Managed Care – PPO | Admitting: Nurse Practitioner

## 2022-01-26 VITALS — BP 112/69 | HR 76 | Temp 97.9°F | Ht 61.0 in | Wt 145.4 lb

## 2022-01-26 DIAGNOSIS — Z91018 Allergy to other foods: Secondary | ICD-10-CM

## 2022-01-26 DIAGNOSIS — Z6826 Body mass index (BMI) 26.0-26.9, adult: Secondary | ICD-10-CM | POA: Insufficient documentation

## 2022-01-26 DIAGNOSIS — N390 Urinary tract infection, site not specified: Secondary | ICD-10-CM | POA: Diagnosis not present

## 2022-01-26 DIAGNOSIS — Z6827 Body mass index (BMI) 27.0-27.9, adult: Secondary | ICD-10-CM

## 2022-01-26 DIAGNOSIS — R319 Hematuria, unspecified: Secondary | ICD-10-CM

## 2022-01-26 DIAGNOSIS — Z7689 Persons encountering health services in other specified circumstances: Secondary | ICD-10-CM

## 2022-01-26 DIAGNOSIS — R300 Dysuria: Secondary | ICD-10-CM | POA: Insufficient documentation

## 2022-01-26 LAB — POCT URINALYSIS DIP (CLINITEK)
Glucose, UA: NEGATIVE mg/dL
Nitrite, UA: NEGATIVE
Spec Grav, UA: >=1.03 — AB (ref 1.010–1.025)
Urobilinogen, UA: 0.2 E.U./dL
pH, UA: 6 (ref 5.0–8.0)

## 2022-01-26 MED ORDER — CIPROFLOXACIN HCL 500 MG PO TABS: 500.0000 mg | ORAL_TABLET | Freq: Two times a day (BID) | ORAL | 0 refills | Status: DC

## 2022-01-26 NOTE — Patient Instructions (Signed)
Fat and Cholesterol Restricted Eating Plan Getting too much fat and cholesterol in your diet may cause health problems. Choosing the right foods helps keep your fat and cholesterol at normal levels. This can keep you from getting certain diseases. Your doctor may recommend an eating plan that includes: Total fat: ______% or less of total calories a day. This is ______g of fat a day. Saturated fat: ______% or less of total calories a day. This is ______g of saturated fat a day. Cholesterol: less than _________mg a day. Fiber: ______g a day. What are tips for following this plan? General tips Work with your doctor to lose weight if you need to. Avoid: Foods with added sugar. Fried foods. Foods with trans fat or partially hydrogenated oils. This includes some margarines and baked goods. If you drink alcohol: Limit how much you have to: 0-1 drink a day for women who are not pregnant. 0-2 drinks a day for men. Know how much alcohol is in a drink. In the [place] one drink equals one 12 oz bottle of beer (355 mL), one 5 oz glass of wine (148 mL), or one 1 oz glass of hard liquor (44 mL). Reading food labels Check food labels for: Trans fats. Partially hydrogenated oils. Saturated fat (g) in each serving. Cholesterol (mg) in each serving. Fiber (g) in each serving. Choose foods with healthy fats, such as: Monounsaturated fats and polyunsaturated fats. These include olive and canola oil, flaxseeds, walnuts, almonds, and seeds. Omega-3 fats. These are found in certain fish, flaxseed oil, and ground flaxseeds. Choose grain products that have whole grains. Look for the word "whole" as the first word in the ingredient list. Cooking Cook foods using low-fat methods. These include baking, boiling, grilling, and broiling. Eat more home-cooked foods. Eat at restaurants and buffets less often. Eat less fast food. Avoid cooking using saturated fats, such as butter, cream, palm oil, palm kernel oil, and  coconut oil. Meal planning  At meals, divide your plate into four equal parts: Fill one-half of your plate with vegetables, green salads, and fruit. Fill one-fourth of your plate with whole grains. Fill one-fourth of your plate with low-fat (lean) protein foods. Eat fish that is high in omega-3 fats at least two times a week. This includes mackerel, tuna, sardines, and salmon. Eat foods that are high in fiber, such as whole grains, beans, apples, pears, berries, broccoli, carrots, peas, and barley. What foods should I eat? Fruits All fresh, canned (in natural juice), or frozen fruits. Vegetables Fresh or frozen vegetables (raw, steamed, roasted, or grilled). Green salads. Grains Whole grains, such as whole wheat or whole grain breads, crackers, cereals, and pasta. Unsweetened oatmeal, bulgur, barley, quinoa, or brown rice. Corn or whole wheat flour tortillas. Meats and other protein foods Ground beef (85% or leaner), grass-fed beef, or beef trimmed of fat. Skinless chicken or turkey. Ground chicken or turkey. Pork trimmed of fat. All fish and seafood. Egg whites. Dried beans, peas, or lentils. Unsalted nuts or seeds. Unsalted canned beans. Nut butters without added sugar or oil. Dairy Low-fat or nonfat dairy products, such as skim or 1% milk, 2% or reduced-fat cheeses, low-fat and fat-free ricotta or cottage cheese, or plain low-fat and nonfat yogurt. Fats and oils Tub margarine without trans fats. Light or reduced-fat mayonnaise and salad dressings. Avocado. Olive, canola, sesame, or safflower oils. The items listed above may not be a complete list of foods and beverages you can eat. Contact a dietitian for more information. What foods   should I avoid? Fruits Canned fruit in heavy syrup. Fruit in cream or butter sauce. Fried fruit. Vegetables Vegetables cooked in cheese, cream, or butter sauce. Fried vegetables. Grains White bread. White pasta. White rice. Cornbread. Bagels, pastries,  and croissants. Crackers and snack foods that contain trans fat and hydrogenated oils. Meats and other protein foods Fatty cuts of meat. Ribs, chicken wings, bacon, sausage, bologna, salami, chitterlings, fatback, hot dogs, bratwurst, and packaged lunch meats. Liver and organ meats. Whole eggs and egg yolks. Chicken and turkey with skin. Fried meat. Dairy Whole or 2% milk, cream, half-and-half, and cream cheese. Whole milk cheeses. Whole-fat or sweetened yogurt. Full-fat cheeses. Nondairy creamers and whipped toppings. Processed cheese, cheese spreads, and cheese curds. Fats and oils Butter, stick margarine, lard, shortening, ghee, or bacon fat. Coconut, palm kernel, and palm oils. Beverages Alcohol. Sugar-sweetened drinks such as sodas, lemonade, and fruit drinks. Sweets and desserts Corn syrup, sugars, honey, and molasses. Candy. Jam and jelly. Syrup. Sweetened cereals. Cookies, pies, cakes, donuts, muffins, and ice cream. The items listed above may not be a complete list of foods and beverages you should avoid. Contact a dietitian for more information. Summary Choosing the right foods helps keep your fat and cholesterol at normal levels. This can keep you from getting certain diseases. At meals, fill one-half of your plate with vegetables, green salads, and fruits. Eat high fiber foods, like whole grains, beans, apples, pears, berries, carrots, peas, and barley. Limit added sugar, saturated fats, alcohol, and fried foods. This information is not intended to replace advice given to you by your health care provider. Make sure you discuss any questions you have with your health care provider. Document Revised: 03/25/2021 Document Reviewed: 03/25/2021 Elsevier Patient Education  2022 [employer]  

## 2022-01-26 NOTE — Progress Notes (Signed)
? ?New Patient Office Visit ? ?Subjective:  ?Patient ID: [name], female    DOB: [birth year]  Age: 41 y.o. MRN: [id number] ? ?CC:  ?Chief Complaint  ?Patient presents with  ? New Patient (Initial Visit)  ? ? ?HPI ?[name] presents to establish new primary care provider.  ?-she states that feb. 9th, she saw [facility] due to low back and pelvic pain. Took macrobid twice daily. Symptoms never improved.  ?-went to urgent care on 2/17 when symptoms persisted. Urine sample was negative for infection at the time. They did still put heron cipro twice daily. Culture showed >10000 colonies of urogenital flora. She stopped taking the cipro after just a few days.  ?-still has some lower back pain. This shifts from right to left side. She continues to have urinary frequency.  ?-has noted that many foods cause her to have itchy nose and allergy type symptoms.  ?-has not had primary care provider in some time.  ?-due to have routine, fasting labs and annual wellness visit.  ?-sees GYN and has appointment for well woman exam next month  ? ?History reviewed. No pertinent past medical history. ? ?History reviewed. No pertinent surgical history. ? ?History reviewed. No pertinent family history. ? ?Social History  ? ?Socioeconomic History  ? Marital status: Single  ?  Spouse name: Not on file  ? Number of children: Not on file  ? Years of education: Not on file  ? Highest education level: Not on file  ?Occupational History  ? Not on file  ?Tobacco Use  ? Smoking status: Never  ? Smokeless tobacco: Never  ?Substance and Sexual Activity  ? Alcohol use: Yes  ?  Comment: occasional  ? Drug use: No  ? Sexual activity: Yes  ?  Birth control/protection: Condom  ?Other Topics Concern  ? Not on file  ?Social History Narrative  ? Not on file  ? ?Social Determinants of Health  ? ?Financial Resource Strain: Not on file  ?Food Insecurity: Not on file  ?Transportation Needs: Not on file  ?Physical Activity: Not on file  ?Stress: Not on  file  ?Social Connections: Not on file  ?Intimate Partner Violence: Not on file  ? ? ?ROS ?Review of Systems  ?Constitutional:  Negative for activity change, appetite change, chills, fatigue and fever.  ?HENT:  Negative for congestion, postnasal drip, rhinorrhea, sinus pressure, sinus pain, sneezing and sore throat.   ?Eyes: Negative.   ?Respiratory:  Negative for cough, chest tightness, shortness of breath and wheezing.   ?Cardiovascular:  Negative for chest pain and palpitations.  ?Gastrointestinal:  Negative for abdominal pain, constipation, diarrhea, nausea and vomiting.  ?Endocrine: Negative for cold intolerance, heat intolerance, polydipsia and polyuria.  ?Genitourinary:  Positive for frequency and pelvic pain. Negative for dyspareunia, dysuria, flank pain and urgency.  ?Musculoskeletal:  Positive for back pain. Negative for arthralgias and myalgias.  ?Skin:  Negative for rash.  ?Allergic/Immunologic: Negative for environmental allergies.  ?Neurological:  Negative for dizziness, weakness and headaches.  ?Hematological:  Negative for adenopathy.  ?Psychiatric/Behavioral:  The patient is not nervous/anxious.   ? ?Objective:  ? ?Today's Vitals  ? 01/26/22 0939  ?BP: 112/69  ?Pulse: 76  ?Temp: 97.9 ?F (36.6 ?C)  ?SpO2: 100%  ?Weight: 145 lb 6.4 oz (66 kg)  ?Height: 5\' 1"  (1.549 m)  ? ?Body mass index is 27.47 kg/m?.  ? ?Physical Exam ?Vitals and nursing note reviewed.  ?Constitutional:   ?   Appearance: Normal appearance. She is well-developed.  ?  HENT:  ?   Head: Normocephalic and atraumatic.  ?Eyes:  ?   Pupils: Pupils are equal, round, and reactive to light.  ?Cardiovascular:  ?   Rate and Rhythm: Normal rate and regular rhythm.  ?   Pulses: Normal pulses.  ?   Heart sounds: Normal heart sounds.  ?Pulmonary:  ?   Effort: Pulmonary effort is normal.  ?   Breath sounds: Normal breath sounds.  ?Abdominal:  ?   Palpations: Abdomen is soft.  ?Genitourinary: ?   Comments: Urine sample positive for small WBC and small  blood. There is also small protein.  ?Musculoskeletal:     ?   General: Normal range of motion.  ?   Cervical back: Normal range of motion and neck supple.  ?Lymphadenopathy:  ?   Cervical: No cervical adenopathy.  ?Skin: ?   General: Skin is warm and dry.  ?   Capillary Refill: Capillary refill takes less than 2 seconds.  ?Neurological:  ?   General: No focal deficit present.  ?   Mental Status: She is alert and oriented to person, place, and time.  ?Psychiatric:     ?   Mood and Affect: Mood normal.     ?   Behavior: Behavior normal.     ?   Thought Content: Thought content normal.     ?   Judgment: Judgment normal.  ? ? ?Assessment & Plan:  ?1. Encounter to establish care ?Appointment today to establish new primary care provider   ? ?2. Urinary tract infection with hematuria, site unspecified ?Restart cipro 500mg  twice daily for 7 days as this is third time in one month she has been on antibiotics for uti. Send urine for culture and sensitivity and adjust antibiotics as indicated.  ?- ciprofloxacin (CIPRO) 500 MG tablet; Take 1 tablet (500 mg total) by mouth 2 (two) times daily.  Dispense: 14 tablet; Refill: 0 ? ?3. Dysuria ?Treat for uti with cipro twice daily. Send urine for culture and sensitivity and adjust antibiotics as indicated.   ?- POCT URINALYSIS DIP (CLINITEK) ?- Urine Culture; Future ?- Urine Culture ? ?4. Body mass index 27.0-27.9, adult ?Discussed lowering calorie intake to 1500 calories per day and incorporating exercise into daily routine to help lose weight. ? ?5. Food allergy ?Will check labs for basic food allergies and vegetable allergies. Will discuss results with patient at next visit . ? ?  ?Problem List Items Addressed This Visit   ? ?  ? Genitourinary  ? Urinary tract infection with hematuria  ? Relevant Medications  ? ciprofloxacin (CIPRO) 500 MG tablet  ?  ? Other  ? Dysuria  ? Relevant Orders  ? POCT URINALYSIS DIP (CLINITEK) (Completed)  ? Urine Culture  ? Body mass index 27.0-27.9,  adult  ? Food allergy  ? ?Other Visit Diagnoses   ? ? Encounter to establish care    -  Primary  ? ?  ? ? ?Outpatient Encounter Medications as of 01/26/2022  ?Medication Sig  ? ciprofloxacin (CIPRO) 500 MG tablet Take 1 tablet (500 mg total) by mouth 2 (two) times daily.  ? [DISCONTINUED] cyclobenzaprine (FLEXERIL) 10 MG tablet Take 1 tablet (10 mg total) by mouth 2 (two) times daily as needed for muscle spasms. (Patient not taking: Reported on 08/06/2019)  ? [DISCONTINUED] ibuprofen (ADVIL,MOTRIN) 800 MG tablet Take 1 tablet (800 mg total) by mouth 3 (three) times daily. (Patient not taking: Reported on 08/06/2019)  ? ?No facility-administered encounter medications on file  as of 01/26/2022.  ? ? ?Follow-up: Return in about 3 weeks (around 02/16/2022), or annual wellness visit. recheck u/a, for FBW a week prior to visit, add food allergy panel and vegetable allergy profile to labs .  ? ?Carlean Jews, NP ? ?

## 2022-01-28 LAB — URINE CULTURE

## 2022-01-30 NOTE — Progress Notes (Signed)
Patient was started on cipro at time of visit, though urine sample appears improved.

## 2022-02-06 ENCOUNTER — Other Ambulatory Visit: Payer: Self-pay

## 2022-02-08 ENCOUNTER — Other Ambulatory Visit: Payer: Self-pay

## 2022-02-08 DIAGNOSIS — T7804XA Anaphylactic reaction due to fruits and vegetables, initial encounter: Secondary | ICD-10-CM

## 2022-02-08 DIAGNOSIS — Z0000 Encounter for general adult medical examination without abnormal findings: Secondary | ICD-10-CM

## 2022-02-08 DIAGNOSIS — Z91018 Allergy to other foods: Secondary | ICD-10-CM

## 2022-02-09 ENCOUNTER — Other Ambulatory Visit: Payer: BC Managed Care – PPO

## 2022-02-09 ENCOUNTER — Other Ambulatory Visit: Payer: Self-pay

## 2022-02-10 ENCOUNTER — Encounter: Payer: Self-pay | Admitting: Nurse Practitioner

## 2022-02-10 NOTE — Progress Notes (Signed)
Thus far, labs good. Waiting on allergy panels. Discuss with patient at visit 02/16/2022

## 2022-02-11 LAB — COMPREHENSIVE METABOLIC PANEL
ALT: 12 IU/L (ref 0–32)
AST: 20 IU/L (ref 0–40)
Albumin/Globulin Ratio: 1.8 (ref 1.2–2.2)
Albumin: 4.8 g/dL (ref 3.8–4.8)
Alkaline Phosphatase: 83 IU/L (ref 44–121)
BUN/Creatinine Ratio: 12 (ref 9–23)
BUN: 9 mg/dL (ref 6–20)
Bilirubin Total: 0.8 mg/dL (ref 0.0–1.2)
CO2: 24 mmol/L (ref 20–29)
Calcium: 10 mg/dL (ref 8.7–10.2)
Chloride: 102 mmol/L (ref 96–106)
Creatinine, Ser: 0.76 mg/dL (ref 0.57–1.00)
Globulin, Total: 2.6 g/dL (ref 1.5–4.5)
Glucose: 86 mg/dL (ref 70–99)
Potassium: 4.4 mmol/L (ref 3.5–5.2)
Sodium: 140 mmol/L (ref 134–144)
Total Protein: 7.4 g/dL (ref 6.0–8.5)
eGFR: 103 mL/min/{1.73_m2} (ref >59)

## 2022-02-11 LAB — ALLERGEN PROFILE, BASIC FOOD
Beef IgE: <0.1 kU/L
Chocolate/Cacao IgE: <0.1 kU/L
Egg, Whole IgE: <0.1 kU/L
Food Mix (Seafoods) IgE: NEGATIVE
Pork IgE: <0.1 kU/L

## 2022-02-11 LAB — FOOD ALLERGY PROFILE
Allergen Corn, IgE: <0.1 kU/L
Clam IgE: <0.1 kU/L
Codfish IgE: <0.1 kU/L
Egg White IgE: <0.1 kU/L
Milk IgE: <0.1 kU/L
Peanut IgE: <0.1 kU/L
Scallop IgE: <0.1 kU/L
Sesame Seed IgE: <0.1 kU/L
Shrimp IgE: 0.19 kU/L — AB
Soybean IgE: <0.1 kU/L
Walnut IgE: <0.1 kU/L
Wheat IgE: <0.1 kU/L

## 2022-02-11 LAB — LIPID PANEL
Chol/HDL Ratio: 2.6 ratio (ref 0.0–4.4)
Cholesterol, Total: 161 mg/dL (ref 100–199)
HDL: 63 mg/dL (ref >39)
LDL Chol Calc (NIH): 86 mg/dL (ref 0–99)
Triglycerides: 63 mg/dL (ref 0–149)
VLDL Cholesterol Cal: 12 mg/dL (ref 5–40)

## 2022-02-11 LAB — CBC
Hematocrit: 41.1 % (ref 34.0–46.6)
Hemoglobin: 14.1 g/dL (ref 11.1–15.9)
MCH: 32 pg (ref 26.6–33.0)
MCHC: 34.3 g/dL (ref 31.5–35.7)
MCV: 93 fL (ref 79–97)
Platelets: 324 10*3/uL (ref 150–450)
RBC: 4.4 x10E6/uL (ref 3.77–5.28)
RDW: 11.1 % — ABNORMAL LOW (ref 11.7–15.4)
WBC: 10.2 10*3/uL (ref 3.4–10.8)

## 2022-02-11 LAB — HEMOGLOBIN A1C
Est. average glucose Bld gHb Est-mCnc: 105 mg/dL
Hgb A1c MFr Bld: 5.3 % (ref 4.8–5.6)

## 2022-02-11 LAB — TSH: TSH: 2.55 u[IU]/mL (ref 0.450–4.500)

## 2022-02-12 NOTE — Progress Notes (Signed)
Very mild shrimp allergy noted. Other labs ok.

## 2022-02-16 ENCOUNTER — Encounter: Payer: Self-pay | Admitting: Nurse Practitioner

## 2022-02-16 ENCOUNTER — Ambulatory Visit (INDEPENDENT_AMBULATORY_CARE_PROVIDER_SITE_OTHER): Payer: BC Managed Care – PPO | Admitting: Nurse Practitioner

## 2022-02-16 ENCOUNTER — Other Ambulatory Visit: Payer: Self-pay

## 2022-02-16 VITALS — BP 105/66 | HR 77 | Temp 98.3°F | Ht 61.0 in | Wt 142.3 lb

## 2022-02-16 DIAGNOSIS — M5441 Lumbago with sciatica, right side: Secondary | ICD-10-CM

## 2022-02-16 DIAGNOSIS — R319 Hematuria, unspecified: Secondary | ICD-10-CM | POA: Diagnosis not present

## 2022-02-16 DIAGNOSIS — Z0001 Encounter for general adult medical examination with abnormal findings: Secondary | ICD-10-CM | POA: Diagnosis not present

## 2022-02-16 DIAGNOSIS — N390 Urinary tract infection, site not specified: Secondary | ICD-10-CM

## 2022-02-16 LAB — POCT URINALYSIS DIP (CLINITEK)
Bilirubin, UA: NEGATIVE
Glucose, UA: NEGATIVE mg/dL
Ketones, POC UA: NEGATIVE mg/dL
Nitrite, UA: NEGATIVE
POC PROTEIN,UA: NEGATIVE
Spec Grav, UA: >=1.03 — AB (ref 1.010–1.025)
Urobilinogen, UA: 0.2 E.U./dL
pH, UA: 6 (ref 5.0–8.0)

## 2022-02-16 NOTE — Progress Notes (Signed)
Established patient visit ? ? ?Patient: [name]   DOB: 1981/11/07   41 y.o. Female  MRN: [id number] ?Visit Date: 02/16/2022 ? ? ?Chief Complaint  ?Patient presents with  ? Annual Exam  ? ?Subjective  ?  ?HPI  ?The patient is here for annual exam.  ?-urine sample shows that uti has cleared.  ?-has intermittent right sided low back pain which comes and goes. This is describes as a burning sensation. Lasts for about 20 minutes and then resolves on it's own. Will take tylenol for this which does ease the pain  ?-does have low back pain which is midline. Describes this as an ache and is associated with exertion . ? ? ?Medications: ?Outpatient Medications Prior to Visit  ?Medication Sig  ? [DISCONTINUED] ciprofloxacin (CIPRO) 500 MG tablet Take 1 tablet (500 mg total) by mouth 2 (two) times daily.  ? ?No facility-administered medications prior to visit.  ? ? ?Review of Systems  ?Eyes:  Positive for discharge.  ? ?Last CBC ?Lab Results  ?Component Value Date  ? WBC 10.2 02/09/2022  ? HGB 14.1 02/09/2022  ? HCT 41.1 02/09/2022  ? MCV 93 02/09/2022  ? MCH 32.0 02/09/2022  ? RDW 11.1 (L) 02/09/2022  ? PLT 324 02/09/2022  ? ?Last metabolic panel ?Lab Results  ?Component Value Date  ? GLUCOSE 86 02/09/2022  ? NA 140 02/09/2022  ? K 4.4 02/09/2022  ? CL 102 02/09/2022  ? CO2 24 02/09/2022  ? BUN 9 02/09/2022  ? CREATININE 0.76 02/09/2022  ? EGFR 103 02/09/2022  ? CALCIUM 10.0 02/09/2022  ? PROT 7.4 02/09/2022  ? ALBUMIN 4.8 02/09/2022  ? LABGLOB 2.6 02/09/2022  ? AGRATIO 1.8 02/09/2022  ? BILITOT 0.8 02/09/2022  ? ALKPHOS 83 02/09/2022  ? AST 20 02/09/2022  ? ALT 12 02/09/2022  ? ANIONGAP 8 08/06/2019  ? ?Last lipids ?Lab Results  ?Component Value Date  ? CHOL 161 02/09/2022  ? HDL 63 02/09/2022  ? [place] 86 02/09/2022  ? TRIG 63 02/09/2022  ? CHOLHDL 2.6 02/09/2022  ? ?Last hemoglobin A1c ?Lab Results  ?Component Value Date  ? HGBA1C 5.3 02/09/2022  ? ?Last thyroid functions ?Lab Results  ?Component Value Date  ? TSH  2.550 02/09/2022  ? ? ?  ? ? Objective  ?  ? ?Today's Vitals  ? 02/16/22 1030  ?BP: 105/66  ?Pulse: 77  ?Temp: 98.3 ?F (36.8 ?C)  ?SpO2: 98%  ?Weight: 142 lb 4.8 oz (64.5 kg)  ?Height: $RemoveB'5\' 1"'LIueiLAd$  (1.549 m)  ? ?Body mass index is 26.89 kg/m?.  ? ?BP Readings from Last 3 Encounters:  ?02/16/22 105/66  ?01/26/22 112/69  ?08/06/19 128/67  ?  ?Wt Readings from Last 3 Encounters:  ?02/16/22 142 lb 4.8 oz (64.5 kg)  ?01/26/22 145 lb 6.4 oz (66 kg)  ?08/31/17 138 lb (62.6 kg)  ?  ?Physical Exam ?Vitals and nursing note reviewed.  ?Constitutional:   ?   Appearance: Normal appearance. She is well-developed.  ?HENT:  ?   Head: Normocephalic and atraumatic.  ?   Right Ear: Tympanic membrane, ear canal and external ear normal.  ?   Left Ear: Tympanic membrane, ear canal and external ear normal.  ?   Nose: Nose normal.  ?   Mouth/Throat:  ?   Mouth: Mucous membranes are moist.  ?   Pharynx: Oropharynx is clear.  ?Eyes:  ?   Extraocular Movements: Extraocular movements intact.  ?   Conjunctiva/sclera: Conjunctivae normal.  ?   Pupils:  Pupils are equal, round, and reactive to light.  ?Cardiovascular:  ?   Rate and Rhythm: Normal rate and regular rhythm.  ?   Pulses: Normal pulses.  ?   Heart sounds: Normal heart sounds.  ?Pulmonary:  ?   Effort: Pulmonary effort is normal.  ?   Breath sounds: Normal breath sounds.  ?Abdominal:  ?   General: Bowel sounds are normal. There is no distension.  ?   Palpations: Abdomen is soft. There is no mass.  ?   Tenderness: There is no abdominal tenderness. There is no guarding or rebound.  ?   Hernia: No hernia is present.  ?Genitourinary: ?   Comments: Urine sample positive for trace WBC and small blood  ?Musculoskeletal:     ?   General: Normal range of motion.  ?   Cervical back: Normal range of motion and neck supple.  ?Lymphadenopathy:  ?   Cervical: No cervical adenopathy.  ?Skin: ?   General: Skin is warm and dry.  ?   Capillary Refill: Capillary refill takes less than 2 seconds.  ?Neurological:   ?   General: No focal deficit present.  ?   Mental Status: She is alert and oriented to person, place, and time.  ?Psychiatric:     ?   Mood and Affect: Mood normal.     ?   Behavior: Behavior normal.     ?   Thought Content: Thought content normal.     ?   Judgment: Judgment normal.  ?  ? ? ?Results for orders placed or performed in visit on 02/16/22  ?POCT URINALYSIS DIP (CLINITEK)  ?Result Value Ref Range  ? Color, UA yellow yellow  ? Clarity, UA clear clear  ? Glucose, UA negative negative mg/dL  ? Bilirubin, UA negative negative  ? Ketones, POC UA negative negative mg/dL  ? Spec Grav, UA >=1.030 (A) 1.010 - 1.025  ? Blood, UA small (A) negative  ? pH, UA 6.0 5.0 - 8.0  ? POC PROTEIN,UA negative negative, trace  ? Urobilinogen, UA 0.2 0.2 or 1.0 E.U./dL  ? Nitrite, UA Negative Negative  ? Leukocytes, UA Trace (A) Negative  ? ? Assessment & Plan  ?  ?1. Encounter for general adult medical examination with abnormal findings ?Annual wellness visit today. Patient ha upcoming appointment with GYN for well woman exam with pap smear  ? ?2. Urinary tract infection with hematuria, site unspecified ?Resolved. U/a positive for trace WBC and small blood, improved from most recent visit and without urinary symptoms.  ?- POCT URINALYSIS DIP (CLINITEK) ? ?3. Acute midline low back pain with right-sided sciatica ?Will get x-ray of lumbar spine for further evaluation. Recommend she take tylenol as needed for pain. Keep doing active, gentle stretches to prevent further problems.  ?- DG Lumbar Spine 2-3 Views; Future  ? ?Problem List Items Addressed This Visit   ? ?  ? Nervous and Auditory  ? Acute midline low back pain with right-sided sciatica  ? Relevant Orders  ? DG Lumbar Spine 2-3 Views  ?  ? Genitourinary  ? Urinary tract infection with hematuria  ? Relevant Orders  ? POCT URINALYSIS DIP (CLINITEK) (Completed)  ? ?Other Visit Diagnoses   ? ? Encounter for general adult medical examination with abnormal findings    -   Primary  ? ?  ?  ? ?Return in about 1 year (around 02/17/2023) for health maintenance exam, FBW a week prior to visit.  ?   ? ? ? ? ?  Ronnell Freshwater, NP  ?[facility] ?[phone] (phone) ?[phone] (fax) ? ?[facility]  ?

## 2022-07-21 ENCOUNTER — Encounter: Payer: Self-pay | Admitting: Nurse Practitioner

## 2022-07-21 ENCOUNTER — Encounter (HOSPITAL_COMMUNITY): Payer: Self-pay

## 2022-07-21 ENCOUNTER — Ambulatory Visit (HOSPITAL_COMMUNITY)
Admission: RE | Admit: 2022-07-21 | Discharge: 2022-07-21 | Disposition: A | Discharge status: Home / Self Care | Payer: BC Managed Care – PPO | Source: Ambulatory Visit | Attending: Emergency Medicine | Admitting: Emergency Medicine

## 2022-07-21 VITALS — BP 127/83 | HR 59 | Temp 98.8°F | Resp 16

## 2022-07-21 DIAGNOSIS — H6503 Acute serous otitis media, bilateral: Secondary | ICD-10-CM

## 2022-07-21 MED ORDER — FLUTICASONE PROPIONATE 50 MCG/ACT NA SUSP: 2.0000 | Freq: Every day | NASAL | 0 refills | Status: DC

## 2022-07-21 MED ORDER — PSEUDOEPHEDRINE HCL 60 MG PO TABS: 60.0000 mg | ORAL_TABLET | Freq: Four times a day (QID) | ORAL | 0 refills | Status: AC | PRN

## 2022-07-21 NOTE — ED Triage Notes (Signed)
Wore ear plugs while flying. Reports left ear pain that is intermittent while traveling. Reports left clogged and having hard time hearing since yesterday.

## 2022-07-21 NOTE — Discharge Instructions (Addendum)
The fluid in your ears should clear up with time. There is not specific medicine to help this, but can try the medications prescribed. Recommend follow-up with ENT if no improvement in a couple weeks.

## 2022-07-21 NOTE — ED Provider Notes (Signed)
[facility]    CSN: [id number] Arrival date & time: 07/21/22  1751      History   Chief Complaint Chief Complaint  Patient presents with   Ear Fullness    Had pain in ear on plane ride coming back home and both ears are clogged. Tried multiple things and it's still blocked. Also left ear where pain was is more blocked. - Entered by patient   appt 6    HPI [name] is a 41 y.o. female.   Patient presents with concerns of bilateral ear pressure. She states she recently returned from traveling. She started feeling like her ears were a bit clogged when she flew to her destination but the symptoms worsened significantly after flying home. She reports feeling of pressure and muffled hearing. The left is worse than the right. She states it's not really painful and denies dizziness, nausea/vomiting, headache, fever, or recent cold symptoms. She tried an OTC ear pain drop without much relief.   The history is provided by the patient.  Ear Fullness Pertinent negatives include no headaches and no shortness of breath.    History reviewed. No pertinent past medical history.  Patient Active Problem List   Diagnosis Date Noted   Acute midline low back pain with right-sided sciatica 02/16/2022   Urinary tract infection with hematuria 01/26/2022   Dysuria 01/26/2022   Body mass index 27.0-27.9, adult 01/26/2022   Food allergy 01/26/2022    History reviewed. No pertinent surgical history.  OB History   No obstetric history on file.      Home Medications    Prior to Admission medications   Medication Sig Start Date End Date Taking? Authorizing Provider  fluticasone (FLONASE) 50 MCG/ACT nasal spray Place 2 sprays into both nostrils daily. 07/21/22  Yes Royale Lennartz L, PA  pseudoephedrine (SUDAFED) 60 MG tablet Take 1 tablet (60 mg total) by mouth every 6 (six) hours as needed for up to 7 days for congestion. 07/21/22 07/28/22 Yes Vallery Sa, Rehanna Oloughlin L, PA    Family  History History reviewed. No pertinent family history.  Social History Social History   Tobacco Use   Smoking status: Never   Smokeless tobacco: Never  Substance Use Topics   Alcohol use: Yes    Comment: occasional   Drug use: No     Allergies   Garlic and Onion   Review of Systems Review of Systems  Constitutional:  Negative for fatigue and fever.  HENT:  Negative for congestion, ear discharge, ear pain, rhinorrhea, sore throat and tinnitus.   Respiratory:  Negative for cough and shortness of breath.   Gastrointestinal:  Negative for nausea and vomiting.  Skin:  Negative for rash.  Neurological:  Negative for dizziness and headaches.     Physical Exam Triage Vital Signs ED Triage Vitals  Enc Vitals Group     BP 07/21/22 1816 127/83     Pulse Rate 07/21/22 1816 (!) 59     Resp 07/21/22 1816 16     Temp 07/21/22 1816 98.8 F (37.1 C)     Temp Source 07/21/22 1816 Oral     SpO2 07/21/22 1816 98 %     Weight --      Height --      Head Circumference --      Peak Flow --      Pain Score 07/21/22 1815 0     Pain Loc --      Pain Edu? --  Excl. in GC? --    No data found.  Updated Vital Signs BP 127/83 (BP Location: Right Arm)   Pulse (!) 59   Temp 98.8 F (37.1 C) (Oral)   Resp 16   LMP 07/20/2022   SpO2 98%   Visual Acuity Right Eye Distance:   Left Eye Distance:   Bilateral Distance:    Right Eye Near:   Left Eye Near:    Bilateral Near:     Physical Exam Vitals and nursing note reviewed.  Constitutional:      General: She is not in acute distress. HENT:     Head: Normocephalic.     Right Ear: Ear canal and external ear normal. No tenderness. A middle ear effusion is present. There is no impacted cerumen. No mastoid tenderness. Tympanic membrane is not injected, perforated or erythematous.     Left Ear: Ear canal and external ear normal. No tenderness. A middle ear effusion is present. There is no impacted cerumen. No mastoid tenderness.  Tympanic membrane is not injected, perforated or erythematous.     Nose: Nose normal.     Mouth/Throat:     Mouth: Mucous membranes are moist.     Pharynx: Oropharynx is clear.  Eyes:     Pupils: Pupils are equal, round, and reactive to light.  Cardiovascular:     Rate and Rhythm: Normal rate and regular rhythm.     Heart sounds: Normal heart sounds.  Pulmonary:     Effort: Pulmonary effort is normal.     Breath sounds: Normal breath sounds.  Lymphadenopathy:     Cervical: No cervical adenopathy.  Skin:    Findings: No rash.  Neurological:     Mental Status: She is alert.  Psychiatric:        Mood and Affect: Mood normal.      UC Treatments / Results  Labs (all labs ordered are listed, but only abnormal results are displayed) Labs Reviewed - No data to display  EKG   Radiology No results found.  Procedures Procedures (including critical care time)  Medications Ordered in UC Medications - No data to display  Initial Impression / Assessment and Plan / UC Course  I have reviewed the triage vital signs and the nursing notes.  Pertinent labs & imaging results that were available during my care of the patient were reviewed by me and considered in my medical decision making (see chart for details).     Serous effusions, no evidence of infection - discussed this usually just takes time to resolve. Sent in [place] and Sudafed for pt to try to see if this assists with drainage/resolution. Discussed return precautions.   E/M: 1 acute uncomplicated illness, no data, moderate risk due to prescription management  Final Clinical Impressions(s) / UC Diagnoses   Final diagnoses:  Bilateral acute serous otitis media, recurrence not specified     Discharge Instructions      The fluid in your ears should clear up with time. There is not specific medicine to help this, but can try the medications prescribed. Recommend follow-up with ENT if no improvement in a couple weeks.      ED Prescriptions     Medication Sig Dispense Auth. Provider   fluticasone (FLONASE) 50 MCG/ACT nasal spray Place 2 sprays into both nostrils daily. 1 mL Vallery Sa, Ineze Serrao L, PA   pseudoephedrine (SUDAFED) 60 MG tablet Take 1 tablet (60 mg total) by mouth every 6 (six) hours as needed for up to 7  days for congestion. 28 tablet Abner Greenspan, Veronica Fretz L, PA      PDMP not reviewed this encounter.   Delsa Sale, [place] 07/21/22 1900

## 2022-09-01 ENCOUNTER — Telehealth: Payer: BC Managed Care – PPO | Admitting: Physician Assistant

## 2022-09-01 DIAGNOSIS — M545 Low back pain, unspecified: Secondary | ICD-10-CM | POA: Diagnosis not present

## 2022-09-01 MED ORDER — CYCLOBENZAPRINE HCL 10 MG PO TABS: 5.0000 mg | ORAL_TABLET | Freq: Three times a day (TID) | ORAL | 0 refills | Status: DC | PRN

## 2022-09-01 MED ORDER — NAPROXEN 500 MG PO TABS: 500.0000 mg | ORAL_TABLET | Freq: Two times a day (BID) | ORAL | 0 refills | Status: DC

## 2022-09-01 NOTE — Progress Notes (Signed)

## 2023-02-22 ENCOUNTER — Encounter: Payer: BC Managed Care – PPO | Admitting: Nurse Practitioner

## 2023-02-26 ENCOUNTER — Other Ambulatory Visit: Payer: Self-pay

## 2023-02-26 ENCOUNTER — Encounter (HOSPITAL_COMMUNITY): Payer: Self-pay | Admitting: Emergency Medicine

## 2023-02-26 ENCOUNTER — Emergency Department (HOSPITAL_COMMUNITY): Payer: BC Managed Care – PPO

## 2023-02-26 ENCOUNTER — Emergency Department (HOSPITAL_COMMUNITY)
Admission: EM | Admit: 2023-02-26 | Discharge: 2023-02-26 | Disposition: A | Discharge status: Home / Self Care | Payer: BC Managed Care – PPO | Attending: Emergency Medicine | Admitting: Emergency Medicine

## 2023-02-26 DIAGNOSIS — R300 Dysuria: Secondary | ICD-10-CM | POA: Diagnosis not present

## 2023-02-26 DIAGNOSIS — R112 Nausea with vomiting, unspecified: Secondary | ICD-10-CM | POA: Insufficient documentation

## 2023-02-26 DIAGNOSIS — R350 Frequency of micturition: Secondary | ICD-10-CM | POA: Diagnosis not present

## 2023-02-26 DIAGNOSIS — R109 Unspecified abdominal pain: Secondary | ICD-10-CM | POA: Diagnosis not present

## 2023-02-26 LAB — CBC WITH DIFFERENTIAL/PLATELET
Abs Immature Granulocytes: 0.03 10*3/uL (ref 0.00–0.07)
Basophils Absolute: 0.1 10*3/uL (ref 0.0–0.1)
Basophils Relative: 1 %
Eosinophils Absolute: 0.1 10*3/uL (ref 0.0–0.5)
Eosinophils Relative: 1 %
HCT: 41.8 % (ref 36.0–46.0)
Hemoglobin: 13.7 g/dL (ref 12.0–15.0)
Immature Granulocytes: 0 %
Lymphocytes Relative: 34 %
Lymphs Abs: 2.9 10*3/uL (ref 0.7–4.0)
MCH: 31.3 pg (ref 26.0–34.0)
MCHC: 32.8 g/dL (ref 30.0–36.0)
MCV: 95.4 fL (ref 80.0–100.0)
Monocytes Absolute: 0.8 10*3/uL (ref 0.1–1.0)
Monocytes Relative: 10 %
Neutro Abs: 4.6 10*3/uL (ref 1.7–7.7)
Neutrophils Relative %: 54 %
Platelets: 291 10*3/uL (ref 150–400)
RBC: 4.38 MIL/uL (ref 3.87–5.11)
RDW: 11.4 % — ABNORMAL LOW (ref 11.5–15.5)
WBC: 8.5 10*3/uL (ref 4.0–10.5)
nRBC: 0 % (ref 0.0–0.2)

## 2023-02-26 LAB — CBG MONITORING, ED: Glucose-Capillary: 106 mg/dL — ABNORMAL HIGH (ref 70–99)

## 2023-02-26 LAB — BASIC METABOLIC PANEL
Anion gap: 7 (ref 5–15)
BUN: 11 mg/dL (ref 6–20)
CO2: 28 mmol/L (ref 22–32)
Calcium: 9.2 mg/dL (ref 8.9–10.3)
Chloride: 102 mmol/L (ref 98–111)
Creatinine, Ser: 0.8 mg/dL (ref 0.44–1.00)
GFR, Estimated: >60 mL/min (ref >60)
Glucose, Bld: 95 mg/dL (ref 70–99)
Potassium: 3.8 mmol/L (ref 3.5–5.1)
Sodium: 137 mmol/L (ref 135–145)

## 2023-02-26 LAB — URINALYSIS, ROUTINE W REFLEX MICROSCOPIC
Bilirubin Urine: NEGATIVE
Glucose, UA: NEGATIVE mg/dL
Hgb urine dipstick: NEGATIVE
Ketones, ur: NEGATIVE mg/dL
Nitrite: NEGATIVE
Protein, ur: NEGATIVE mg/dL
Specific Gravity, Urine: 1.011 (ref 1.005–1.030)
pH: 5 (ref 5.0–8.0)

## 2023-02-26 LAB — I-STAT BETA HCG BLOOD, ED (MC, WL, AP ONLY): I-stat hCG, quantitative: <5 m[IU]/mL (ref <5)

## 2023-02-26 MED ORDER — METOCLOPRAMIDE HCL 10 MG PO TABS
10.0000 mg | ORAL_TABLET | Freq: Once | ORAL | Status: AC
  Administered 2023-02-26: 10 mg via ORAL
  Filled 2023-02-26: qty 1

## 2023-02-26 MED ORDER — SODIUM CHLORIDE 0.9 % IV BOLUS
1000.0000 mL | Freq: Once | INTRAVENOUS | Status: AC
  Administered 2023-02-26: 1000 mL via INTRAVENOUS

## 2023-02-26 MED ORDER — KETOROLAC TROMETHAMINE 15 MG/ML IJ SOLN
15.0000 mg | Freq: Once | INTRAMUSCULAR | Status: AC
  Administered 2023-02-26: 15 mg via INTRAVENOUS
  Filled 2023-02-26: qty 1

## 2023-02-26 MED ORDER — ONDANSETRON HCL 4 MG/2ML IJ SOLN
4.0000 mg | Freq: Once | INTRAMUSCULAR | Status: AC
  Administered 2023-02-26: 4 mg via INTRAVENOUS
  Filled 2023-02-26: qty 2

## 2023-02-26 MED ORDER — IBUPROFEN 200 MG PO TABS
600.0000 mg | ORAL_TABLET | Freq: Once | ORAL | Status: AC
  Administered 2023-02-26: 600 mg via ORAL
  Filled 2023-02-26: qty 3

## 2023-02-26 MED ORDER — ONDANSETRON 8 MG PO TBDP
8.0000 mg | ORAL_TABLET | Freq: Once | ORAL | Status: AC
  Administered 2023-02-26: 8 mg via ORAL
  Filled 2023-02-26: qty 1

## 2023-02-26 MED ORDER — METOCLOPRAMIDE HCL 10 MG PO TABS: 10.0000 mg | ORAL_TABLET | Freq: Four times a day (QID) | ORAL | 0 refills | Status: DC

## 2023-02-26 NOTE — Discharge Instructions (Addendum)
You are seen today in the emergency department due to flank pain.  Your workup today was reassuring, no signs of kidney infection or kidney stone.  Continue taking the antibiotic you have been prescribed.  Take the Zofran every 6 hours as needed for nausea.  Return to the emergency department for new or worsening symptoms.  Continue taking the antibiotic you are prescribed.

## 2023-02-26 NOTE — ED Triage Notes (Signed)
Pt reports lower right sided back pain. Reports [facility] and was diagnosed w/ [facility]. Given ABX but reports pain has gotten worse.

## 2023-02-26 NOTE — ED Provider Notes (Signed)
[facility] AT [facility] Provider Note   CSN: [id number] Arrival date & time: 02/26/23  1303     History  Chief Complaint  Patient presents with   Back Pain    [name] is a 42 y.o. female.   Back Pain    This is a 42 year old female presenting to the emergency department due to right flank pain.  Patient states about a week ago she started having dysuria, increased urinary frequency and nausea.  [facility] and diagnosed with a UTI, states she had leukocytes, hematuria and ketones in the urine.  Start taking the antibiotic today, despite that she has been feeling nauseated, she is been vomiting and having pain up the right flank.  Denies any history of kidney stones.  No fevers at home.  Home Medications Prior to Admission medications   Medication Sig Start Date End Date Taking? Authorizing Provider  cyclobenzaprine (FLEXERIL) 10 MG tablet Take 0.5-1 tablets (5-10 mg total) by mouth 3 (three) times daily as needed. 09/01/22   Mar Daring, PA-C  fluticasone (FLONASE) 50 MCG/ACT nasal spray Place 2 sprays into both nostrils daily. 07/21/22   Abner Greenspan, Amy L, PA  metoCLOPramide (REGLAN) 10 MG tablet Take 1 tablet (10 mg total) by mouth every 6 (six) hours. 02/26/23  Yes Sherrill Raring, PA-C  naproxen (NAPROSYN) 500 MG tablet Take 1 tablet (500 mg total) by mouth 2 (two) times daily with a meal. 09/01/22   Burnette, Clearnce Sorrel, PA-C      Allergies    Garlic and Onion    Review of Systems   Review of Systems  Musculoskeletal:  Positive for back pain.    Physical Exam Updated Vital Signs BP 129/73 (BP Location: Right Arm)   Pulse 68   Temp 98 F (36.7 C)   Resp 18   SpO2 99%  Physical Exam Vitals and nursing note reviewed. Exam conducted with a chaperone present.  Constitutional:      Appearance: Normal appearance.  HENT:     Head: Normocephalic and atraumatic.  Eyes:     General: No scleral icterus.        Right eye: No discharge.        Left eye: No discharge.     Extraocular Movements: Extraocular movements intact.     Pupils: Pupils are equal, round, and reactive to light.  Cardiovascular:     Rate and Rhythm: Normal rate and regular rhythm.     Pulses: Normal pulses.     Heart sounds: Normal heart sounds. No murmur heard.    No friction rub. No gallop.  Pulmonary:     Effort: Pulmonary effort is normal. No respiratory distress.     Breath sounds: Normal breath sounds.  Abdominal:     General: Abdomen is flat. Bowel sounds are normal. There is no distension.     Palpations: Abdomen is soft.     Tenderness: There is no abdominal tenderness. There is right CVA tenderness.  Skin:    General: Skin is warm and dry.     Coloration: Skin is not jaundiced.  Neurological:     Mental Status: She is alert. Mental status is at baseline.     Coordination: Coordination normal.     ED Results / Procedures / Treatments   Labs (all labs ordered are listed, but only abnormal results are displayed) Labs Reviewed  URINALYSIS, ROUTINE W REFLEX MICROSCOPIC - Abnormal; Notable for the following components:  Result Value   Color, Urine STRAW (*)    Leukocytes,Ua MODERATE (*)    Bacteria, UA RARE (*)    All other components within normal limits  CBC WITH DIFFERENTIAL/PLATELET - Abnormal; Notable for the following components:   RDW 11.4 (*)    All other components within normal limits  CBG MONITORING, ED - Abnormal; Notable for the following components:   Glucose-Capillary 106 (*)    All other components within normal limits  BASIC METABOLIC PANEL  I-STAT BETA HCG BLOOD, ED ([facility], [facility], [facility] ONLY)    EKG None  Radiology CT Renal Stone Study  Result Date: 02/26/2023 CLINICAL DATA:  RIGHT abdominal, flank, and back pain, suspected kidney stone EXAM: CT ABDOMEN AND PELVIS WITHOUT CONTRAST TECHNIQUE: Multidetector CT imaging of the abdomen and pelvis was performed following the standard  protocol without IV contrast. RADIATION DOSE REDUCTION: This exam was performed according to the departmental dose-optimization program which includes automated exposure control, adjustment of the mA and/or kV according to patient size and/or use of iterative reconstruction technique. COMPARISON:  None Available. FINDINGS: Lower chest: Lung bases clear Hepatobiliary: Contracted gallbladder.  Liver normal appearance. Pancreas: Normal appearance Spleen: Normal appearance Adrenals/Urinary Tract: Adrenal glands normal appearance. Kidneys normal appearance. No hydronephrosis or hydroureter. Questionable tiny calculus at RIGHT ureterovesical junction versus artifact. No additional potential urinary tract calculi. Bladder otherwise normal appearance. Stomach/Bowel: Normal appendix. Scattered stool throughout colon. Stomach and bowel loops otherwise unremarkable. Vascular/Lymphatic: Few pelvic phleboliths. Aorta normal caliber. No adenopathy. Reproductive: Unremarkable uterus and adnexa Other: No free air or free fluid. No hernia or inflammatory process. Musculoskeletal: Unremarkable IMPRESSION: Questionable tiny calculus at RIGHT ureterovesical junction versus artifact. No evidence of hydronephrosis or hydroureter. No additional intra-abdominal or intrapelvic abnormalities. Electronically Signed   By: Lavonia Dana M.D.   On: 02/26/2023 16:31    Procedures Procedures    Medications Ordered in ED Medications  sodium chloride 0.9 % bolus 1,000 mL (0 mLs Intravenous Stopped 02/26/23 2006)  ondansetron (ZOFRAN) injection 4 mg (4 mg Intravenous Given 02/26/23 1738)  ketorolac (TORADOL) 15 MG/ML injection 15 mg (15 mg Intravenous Given 02/26/23 1754)  metoCLOPramide (REGLAN) tablet 10 mg (10 mg Oral Given 02/26/23 1846)  ondansetron (ZOFRAN-ODT) disintegrating tablet 8 mg (8 mg Oral Given 02/26/23 2010)  ibuprofen (ADVIL) tablet 600 mg (600 mg Oral Given 02/26/23 2009)    ED Course/ Medical Decision Making/ A&P                              Medical Decision Making Amount and/or Complexity of Data Reviewed Labs: ordered. Radiology: ordered.  Risk OTC drugs. Prescription drug management.   This is a 46 old female presented to the emergency department due to nausea and vomiting.  She was diagnosed with UTI yesterday, has been having right flank pain.  Differential includes pyelonephritis, UTI, nephrolithiasis, septic stone, sepsis, dehydration, AKI, electrolyte derangement.  History is better by the patient and her husband who is at bedside.  Will check labs, CT renal study to evaluate for stone, will treat with antiemetics, fluids.  Laboratory workup is reassuring, no leukocytosis or anemia, patient not pregnant on this topic.  No AKI.  Urine is notable for leukocyturia consistent with her UTI.  CT renal study is negative for nephrolithiasis.  There is also no stranding although there is the limitation of no contrast. I suspect UTI versus early pyelonephritis.  Will patient continue taking her antibiotic as she  is tolerating p.o. and pain is improved.  Return precautions were discussed with the patient.  She meets no SIRS criteria, clinically not septic.  I think she would benefit from return precautions and outpatient follow-up as opposed to hospitalization.  Patient and husband are comfortable with this plan.        Final Clinical Impression(s) / ED Diagnoses Final diagnoses:  Flank pain    Rx / DC Orders ED Discharge Orders          Ordered    metoCLOPramide (REGLAN) 10 MG tablet  Every 6 hours        02/26/23 2019              Theron Arista, PA-C 02/26/23 2023    Arby Barrette, MD 03/03/23 [phone]

## 2023-04-20 ENCOUNTER — Ambulatory Visit (INDEPENDENT_AMBULATORY_CARE_PROVIDER_SITE_OTHER): Payer: BC Managed Care – PPO | Admitting: Nurse Practitioner

## 2023-04-20 ENCOUNTER — Encounter: Payer: Self-pay | Admitting: Nurse Practitioner

## 2023-04-20 VITALS — BP 124/80 | HR 98 | Ht 61.0 in | Wt 142.0 lb

## 2023-04-20 DIAGNOSIS — F4310 Post-traumatic stress disorder, unspecified: Secondary | ICD-10-CM

## 2023-04-20 DIAGNOSIS — F411 Generalized anxiety disorder: Secondary | ICD-10-CM

## 2023-04-20 MED ORDER — SERTRALINE HCL 25 MG PO TABS
25.0000 mg | ORAL_TABLET | Freq: Every day | ORAL | 3 refills | Status: DC
Start: 2023-04-20 — End: 2023-05-11

## 2023-04-20 NOTE — Progress Notes (Signed)
Established patient visit   Patient: [name]   DOB: 1981-07-23   42 y.o. Female  MRN: [id number] Visit Date: 04/20/2023  Chief Complaint  Patient presents with   Insomnia   Subjective    HPI  The patient is here for acute visit  -severe anxiety since Tuesday morning.   -found her daughter in the kitchen prior to school, soaking wet, standing in a puddle.  -patient's daughter had tried to drown herself in the pond in their  backyard.  -patient states that her daughter showed no signs of depression leading up to this event.  -patient states that she noted that something was not  quite right on Friday after school.  -daughter reassured her that everything was ok. Acted normal over the weekend.  -daughter went to school normally on Monday and seemed fine after school  -patient states that her daughter  confided in police and her brother that she had recently been unhappy.  --failing a computer class  ,-having trouble making friends and connecting to others in school.  The patient is extremely upset about the current situation.  -having trouble sleeping due to the anxiety it has created.  -states that her employer does offer counseling services through employee assistance program.  --patient states that she has already reached out to schedule to schedule appointment and is waiting to make initial appointment.  -her daughter is currently hospitalized through   Medications: Outpatient Medications Prior to Visit  Medication Sig   cyclobenzaprine (FLEXERIL) 10 MG tablet Take 0.5-1 tablets (5-10 mg total) by mouth 3 (three) times daily as needed.   fluticasone (FLONASE) 50 MCG/ACT nasal spray Place 2 sprays into both nostrils daily.   metoCLOPramide (REGLAN) 10 MG tablet Take 1 tablet (10 mg total) by mouth every 6 (six) hours.   naproxen (NAPROSYN) 500 MG tablet Take 1 tablet (500 mg total) by mouth 2 (two) times daily with a meal.   No facility-administered medications prior to  visit.    Review of Systems See HPI     Objective     Today's Vitals   04/20/23 1117  BP: 124/80  Pulse: 98  SpO2: 98%  Weight: 142 lb (64.4 kg)  Height: 5\' 1"  (1.549 m)   Body mass index is 26.83 kg/m.   Physical Exam Vitals and nursing note reviewed.  Constitutional:      Appearance: Normal appearance. She is well-developed.  HENT:     Head: Normocephalic and atraumatic.     Nose: Nose normal.     Mouth/Throat:     Mouth: Mucous membranes are moist.     Pharynx: Oropharynx is clear.  Eyes:     Extraocular Movements: Extraocular movements intact.     Conjunctiva/sclera: Conjunctivae normal.     Pupils: Pupils are equal, round, and reactive to light.  Neck:     Vascular: No carotid bruit.  Cardiovascular:     Rate and Rhythm: Normal rate and regular rhythm.     Pulses: Normal pulses.     Heart sounds: Normal heart sounds.  Pulmonary:     Effort: Pulmonary effort is normal.     Breath sounds: Normal breath sounds.  Abdominal:     Palpations: Abdomen is soft.  Musculoskeletal:        General: Normal range of motion.     Cervical back: Normal range of motion and neck supple.  Lymphadenopathy:     Cervical: No cervical adenopathy.  Skin:    General: Skin is warm and  dry.     Capillary Refill: Capillary refill takes less than 2 seconds.  Neurological:     General: No focal deficit present.     Mental Status: She is alert and oriented to person, place, and time.  Psychiatric:        Attention and Perception: Attention and perception normal.        Mood and Affect: Mood is anxious and depressed. Affect is tearful.        Speech: Speech normal.        Behavior: Behavior normal. Behavior is cooperative.        Thought Content: Thought content normal.        Cognition and Memory: Cognition and memory normal.        Judgment: Judgment normal.       Assessment & Plan     Post-traumatic stress Assessment & Plan: Patient is likely suffering a post traumatic  stress response after the suicide attempt of her daughter.  -start sertraline 25 mg daily for, at least, the short-term future.  -recommend she try contacting her work's employee assistance program for counseling services.  -will complete FMLA paperwork needed for her job.  --initial day out of work was 04/17/2023. We have agreed on initial time  out of work to be 6 weeks so that she may be present for her daughter when her daughter is released home after initial  stay in hospital. Will return to patient ASAP.   Orders: -     Sertraline HCl; Take 1 tablet (25 mg total) by mouth at bedtime.  Dispense: 30 tablet; Refill: 3  Generalized anxiety disorder Assessment & Plan: Patient suffering from significant anxiety since her daughter's recent suicide attempt.  -start sertraline 25 mg daily for, at least, the short-term future.  -recommend she try contacting her work's employee assistance program for counseling services.  -will complete FMLA paperwork needed for her job.  --initial day out of work was 04/17/2023. We have agreed on initial time  out of work to be 6 weeks so that she may be present for her daughter when her daughter is released home after initial  stay in hospital. Will return to patient ASAP.       Return in about 3 weeks (around 05/11/2023) for mood - 3 - 4 weeks for GAD.        Carlean Jews, NP  [facility] at [facility] [phone] (phone) [phone] (fax)  [facility]

## 2023-04-24 ENCOUNTER — Telehealth: Payer: Self-pay

## 2023-04-24 NOTE — Telephone Encounter (Signed)
Pt came and dropped off FMLA paperwork. Pt is aware of the 29$fee and will pay upon picking up the forms.  Pt needs to pick these forms up. She states that they can't be faxed.  Paperwork has been placed in [employer].

## 2023-04-24 NOTE — Telephone Encounter (Signed)
Placed paperwork in provider basket on 04/24/2023

## 2023-04-30 DIAGNOSIS — F4310 Post-traumatic stress disorder, unspecified: Secondary | ICD-10-CM | POA: Insufficient documentation

## 2023-04-30 DIAGNOSIS — F411 Generalized anxiety disorder: Secondary | ICD-10-CM | POA: Insufficient documentation

## 2023-04-30 NOTE — Assessment & Plan Note (Signed)
Patient suffering from significant anxiety since her daughter's recent suicide attempt.  -start sertraline 25 mg daily for, at least, the short-term future.  -recommend she try contacting her work's employee assistance program for counseling services.  -will complete FMLA paperwork needed for her job.  --initial day out of work was 04/17/2023. We have agreed on initial time  out of work to be 6 weeks so that she may be present for her daughter when her daughter is released home after initial  stay in hospital. Will return to patient ASAP.

## 2023-04-30 NOTE — Assessment & Plan Note (Signed)
Patient is likely suffering a post traumatic stress response after the suicide attempt of her daughter.  -start sertraline 25 mg daily for, at least, the short-term future.  -recommend she try contacting her work's employee assistance program for counseling services.  -will complete FMLA paperwork needed for her job.  --initial day out of work was 04/17/2023. We have agreed on initial time  out of work to be 6 weeks so that she may be present for her daughter when her daughter is released home after initial  stay in hospital. Will return to patient ASAP.

## 2023-05-11 ENCOUNTER — Ambulatory Visit (INDEPENDENT_AMBULATORY_CARE_PROVIDER_SITE_OTHER): Payer: BC Managed Care – PPO | Admitting: Nurse Practitioner

## 2023-05-11 ENCOUNTER — Encounter: Payer: Self-pay | Admitting: Nurse Practitioner

## 2023-05-11 VITALS — BP 124/77 | HR 88 | Ht 61.0 in | Wt 141.0 lb

## 2023-05-11 DIAGNOSIS — F4310 Post-traumatic stress disorder, unspecified: Secondary | ICD-10-CM

## 2023-05-11 DIAGNOSIS — Z23 Encounter for immunization: Secondary | ICD-10-CM | POA: Diagnosis not present

## 2023-05-11 MED ORDER — SERTRALINE HCL 50 MG PO TABS
50.0000 mg | ORAL_TABLET | Freq: Every day | ORAL | 1 refills | Status: DC
Start: 2023-05-11 — End: 2023-08-14

## 2023-05-11 NOTE — Progress Notes (Signed)
Established patient visit   Patient: [name]   DOB: 02/08/1981   42 y.o. Female  MRN: [id number] Visit Date: 05/11/2023   Chief Complaint  Patient presents with   Medical Management of Chronic Issues   Subjective    HPI  Follow up -severe anxiety since Tuesday 04/17/2023  -found her daughter in the kitchen prior to school, soaking wet, standing in a puddle.  -patient's daughter had tried to drown herself in the pond in their  backyard.  -patient states that her daughter showed no signs of depression leading up to this eve -started patient on sertraline 25 mg daily at most recent visit -advised she contact her employer's employee assistance program for counseling services.  -will complete FMLA paperwork, allowing patient to stay out of work for 6 weeks  -Patient states that she went back to work after 2 weeks, which was May 01, 2023. -Patient states her daughter is now home.  Overall, daughter's condition is improved.  Medications: Outpatient Medications Prior to Visit  Medication Sig   [DISCONTINUED] cyclobenzaprine (FLEXERIL) 10 MG tablet Take 0.5-1 tablets (5-10 mg total) by mouth 3 (three) times daily as needed.   [DISCONTINUED] fluticasone (FLONASE) 50 MCG/ACT nasal spray Place 2 sprays into both nostrils daily.   [DISCONTINUED] metoCLOPramide (REGLAN) 10 MG tablet Take 1 tablet (10 mg total) by mouth every 6 (six) hours.   [DISCONTINUED] naproxen (NAPROSYN) 500 MG tablet Take 1 tablet (500 mg total) by mouth 2 (two) times daily with a meal.   [DISCONTINUED] sertraline (ZOLOFT) 25 MG tablet Take 1 tablet (25 mg total) by mouth at bedtime.   No facility-administered medications prior to visit.    Review of Systems See HPI      Objective     Today's Vitals   05/11/23 1001  BP: 124/77  Pulse: 88  SpO2: 100%  Weight: 141 lb (64 kg)  Height: 5\' 1"  (1.549 m)   Body mass index is 26.64 kg/m.  BP Readings from Last 3 Encounters:  05/11/23 124/77  04/20/23  124/80  02/26/23 129/73    Wt Readings from Last 3 Encounters:  05/11/23 141 lb (64 kg)  04/20/23 142 lb (64.4 kg)  02/16/22 142 lb 4.8 oz (64.5 kg)    Physical Exam Vitals and nursing note reviewed.  Constitutional:      Appearance: Normal appearance. She is well-developed.  HENT:     Head: Normocephalic and atraumatic.     Nose: Nose normal.     Mouth/Throat:     Mouth: Mucous membranes are moist.     Pharynx: Oropharynx is clear.  Eyes:     Extraocular Movements: Extraocular movements intact.     Conjunctiva/sclera: Conjunctivae normal.     Pupils: Pupils are equal, round, and reactive to light.  Neck:     Vascular: No carotid bruit.  Cardiovascular:     Rate and Rhythm: Normal rate and regular rhythm.     Pulses: Normal pulses.     Heart sounds: Normal heart sounds.  Pulmonary:     Effort: Pulmonary effort is normal.     Breath sounds: Normal breath sounds.  Abdominal:     Palpations: Abdomen is soft.  Musculoskeletal:        General: Normal range of motion.     Cervical back: Normal range of motion and neck supple.  Lymphadenopathy:     Cervical: No cervical adenopathy.  Skin:    General: Skin is warm and dry.     Capillary Refill:  Capillary refill takes less than 2 seconds.  Neurological:     General: No focal deficit present.     Mental Status: She is alert and oriented to person, place, and time.     Gait: Gait normal.  Psychiatric:        Attention and Perception: Attention and perception normal.        Mood and Affect: Mood is anxious and depressed. Affect is tearful.        Speech: Speech normal.        Behavior: Behavior normal. Behavior is cooperative.        Thought Content: Thought content normal.        Cognition and Memory: Cognition and memory normal.        Judgment: Judgment normal.      Assessment & Plan    Post-traumatic stress Assessment & Plan: Patient is likely suffering a post traumatic stress response after the suicide attempt of  her daughter.  -start sertraline 25 mg daily.  Has not really helped..  No negative side effects associated with taking this medication. -Increase sertraline to 50 mg daily. -Contact information given for counseling services as initial contact with employee assistance program through her employer was ineffective.    Orders: -     Sertraline HCl; Take 1 tablet (50 mg total) by mouth at bedtime.  Dispense: 90 tablet; Refill: 1  Need for Tdap vaccination -     Tdap vaccine greater than or equal to 7yo IM     Return in about 4 weeks (around 06/08/2023) for mood.         Carlean Jews, NP  [facility] at [facility] [phone] (phone) [phone] (fax)  [facility]

## 2023-05-14 ENCOUNTER — Encounter: Payer: Self-pay | Admitting: Nurse Practitioner

## 2023-05-18 NOTE — Telephone Encounter (Signed)
Pt made aware that paperwork is ready to be picked up and that the 29.00 fee is needed

## 2023-05-22 NOTE — Telephone Encounter (Signed)
Pt came and picked up paperwork and paid the fee

## 2023-05-27 NOTE — Assessment & Plan Note (Signed)
Patient is likely suffering a post traumatic stress response after the suicide attempt of her daughter.  -start sertraline 25 mg daily.  Has not really helped..  No negative side effects associated with taking this medication. -Increase sertraline to 50 mg daily. -Contact information given for counseling services as initial contact with employee assistance program through her employer was ineffective.

## 2023-06-11 ENCOUNTER — Ambulatory Visit: Payer: BC Managed Care – PPO | Admitting: Family Medicine

## 2023-08-14 ENCOUNTER — Ambulatory Visit (INDEPENDENT_AMBULATORY_CARE_PROVIDER_SITE_OTHER): Payer: BC Managed Care – PPO | Admitting: Family Medicine

## 2023-08-14 ENCOUNTER — Encounter: Payer: Self-pay | Admitting: Family Medicine

## 2023-08-14 VITALS — BP 116/78 | HR 72 | Resp 18 | Ht 61.0 in | Wt 139.0 lb

## 2023-08-14 DIAGNOSIS — Z1159 Encounter for screening for other viral diseases: Secondary | ICD-10-CM

## 2023-08-14 DIAGNOSIS — E663 Overweight: Secondary | ICD-10-CM

## 2023-08-14 DIAGNOSIS — F411 Generalized anxiety disorder: Secondary | ICD-10-CM

## 2023-08-14 DIAGNOSIS — R251 Tremor, unspecified: Secondary | ICD-10-CM | POA: Insufficient documentation

## 2023-08-14 DIAGNOSIS — Z6826 Body mass index (BMI) 26.0-26.9, adult: Secondary | ICD-10-CM

## 2023-08-14 DIAGNOSIS — F4310 Post-traumatic stress disorder, unspecified: Secondary | ICD-10-CM

## 2023-08-14 MED ORDER — SERTRALINE HCL 50 MG PO TABS: 50.0000 mg | ORAL_TABLET | Freq: Every day | ORAL | 1 refills | Status: DC

## 2023-08-14 NOTE — Assessment & Plan Note (Signed)
PHQ-9 score 2, GAD-7 score 7.  Both have significantly increased since last appointment.  Continue Zoloft 50 mg daily.  We discussed options for side effect of bruxism including mouthguard, switching Zoloft to different SSRI/SNRI, or addition of buspirone or gabapentin at bedtime.  She would prefer to start with a mouthguard.

## 2023-08-14 NOTE — Progress Notes (Signed)
Established Patient Office Visit  Subjective   Patient ID: [name], female    DOB: 04-04-1981  Age: 42 y.o. MRN: [id number]  Chief Complaint  Patient presents with   Anxiety   Depression    HPI [name] is a 42 y.o. female presenting today for follow up of mood.  She also would like PCP to be aware of a fairly new action tremor that occurs when she is reaching for something.  She also notes that while it is improving, she sometimes has garbled speech and needs to think about what she is going to say before she says it. Mood: Patient is here to follow up for anxiety, currently managing with Zoloft 50 mg, the dose was increased from 25 mg at last appointment. Taking medication, reports excellent compliance with treatment.  She has developed teeth clenching since making this change.  Denies mood changes or SI/HI. She feels mood is improved since last visit.     08/14/2023    3:04 PM 05/11/2023   10:04 AM 04/20/2023   11:23 AM  Depression screen PHQ 2/9  Decreased Interest 0 3 3  Down, Depressed, Hopeless 0 3 3  PHQ - 2 Score 0 6 6  Altered sleeping 0 1 3  Tired, decreased energy 0 1 2  Change in appetite 0 1 3  Feeling bad or failure about yourself  1 3 3   Trouble concentrating 0 1 3  Moving slowly or fidgety/restless 0 0 3  Suicidal thoughts 1 0 0  PHQ-9 Score 2 13 23   Difficult doing work/chores Not difficult at all Not difficult at all Very difficult       08/14/2023    3:04 PM 04/20/2023   11:24 AM 02/16/2022   10:36 AM 01/26/2022    9:43 AM  GAD 7 : Generalized Anxiety Score  Nervous, Anxious, on Edge 1 2 1  0  Control/stop worrying 1 3 1  0  Worry too much - different things 1 3 1  0  Trouble relaxing 1 3 0 0  Restless 0 1 0 0  Easily annoyed or irritable 1 1 1  0  Afraid - awful might happen 2 3 0 0  Total GAD 7 Score 7 16 4  0  Anxiety Difficulty Not difficult at all Very difficult     Outpatient Medications Prior to Visit  Medication Sig    [DISCONTINUED] sertraline (ZOLOFT) 50 MG tablet Take 1 tablet (50 mg total) by mouth at bedtime.   No facility-administered medications prior to visit.    ROS Negative unless otherwise noted in HPI   Objective:     BP 116/78 (BP Location: Left Arm, Patient Position: Sitting, Cuff Size: Normal)   Pulse 72   Resp 18   Ht 5\' 1"  (1.549 m)   Wt 139 lb (63 kg)   SpO2 99%   BMI 26.26 kg/m   Physical Exam Constitutional:      General: She is not in acute distress.    Appearance: Normal appearance.  HENT:     Head: Normocephalic and atraumatic.  Cardiovascular:     Rate and Rhythm: Normal rate and regular rhythm.     Heart sounds: No murmur heard.    No friction rub. No gallop.  Pulmonary:     Effort: Pulmonary effort is normal. No respiratory distress.     Breath sounds: No wheezing, rhonchi or rales.  Skin:    General: Skin is warm and dry.  Neurological:     Mental Status:  She is alert and oriented to person, place, and time.     Assessment & Plan:  Generalized anxiety disorder Assessment & Plan: PHQ-9 score 2, GAD-7 score 7.  Both have significantly increased since last appointment.  Continue Zoloft 50 mg daily.  We discussed options for side effect of bruxism including mouthguard, switching Zoloft to different SSRI/SNRI, or addition of buspirone or gabapentin at bedtime.  She would prefer to start with a mouthguard.   Post-traumatic stress Assessment & Plan: Continue Zoloft 50 mg daily.  Will continue to monitor.  Orders: -     Sertraline HCl; Take 1 tablet (50 mg total) by mouth at bedtime.  Dispense: 90 tablet; Refill: 1  Tremor Assessment & Plan: Description sounds similar to benign essential tremor.  It is also possible that this is a side effect of the increase in sertraline.  Continue watching and waiting.  At next appointment, can determine if likely side effect of sertraline versus other cause.     Return in about 3 months (around 11/13/2023) for annual  physical, fasting blood work 1 week before.    Beth Quitter, PA

## 2023-08-14 NOTE — Patient Instructions (Signed)
Continue Zoloft 50 mg daily.  I am hoping that the teeth clenching will get better over time.  Until then, you can start wearing a mouthguard to protect her teeth and offer more comfort.  If this is not enough, we could start a medicine at bedtime to help the muscles to relax.  The options are either buspirone or another medicine called gabapentin.

## 2023-08-14 NOTE — Assessment & Plan Note (Signed)
Continue Zoloft 50 mg daily.  Will continue to monitor.

## 2023-08-14 NOTE — Assessment & Plan Note (Signed)
Description sounds similar to benign essential tremor.  It is also possible that this is a side effect of the increase in sertraline.  Continue watching and waiting.  At next appointment, can determine if likely side effect of sertraline versus other cause.

## 2023-11-07 ENCOUNTER — Other Ambulatory Visit: Payer: BC Managed Care – PPO

## 2023-11-07 DIAGNOSIS — Z1159 Encounter for screening for other viral diseases: Secondary | ICD-10-CM

## 2023-11-07 DIAGNOSIS — E663 Overweight: Secondary | ICD-10-CM

## 2023-11-08 LAB — COMPREHENSIVE METABOLIC PANEL
ALT: 16 [IU]/L (ref 0–32)
AST: 20 [IU]/L (ref 0–40)
Albumin: 4.6 g/dL (ref 3.9–4.9)
Alkaline Phosphatase: 93 [IU]/L (ref 44–121)
BUN/Creatinine Ratio: 18 (ref 9–23)
BUN: 13 mg/dL (ref 6–24)
Bilirubin Total: 0.3 mg/dL (ref 0.0–1.2)
CO2: 24 mmol/L (ref 20–29)
Calcium: 9.5 mg/dL (ref 8.7–10.2)
Chloride: 102 mmol/L (ref 96–106)
Creatinine, Ser: 0.71 mg/dL (ref 0.57–1.00)
Globulin, Total: 2.2 g/dL (ref 1.5–4.5)
Glucose: 95 mg/dL (ref 70–99)
Potassium: 4.4 mmol/L (ref 3.5–5.2)
Sodium: 141 mmol/L (ref 134–144)
Total Protein: 6.8 g/dL (ref 6.0–8.5)
eGFR: 110 mL/min/{1.73_m2} (ref >59)

## 2023-11-08 LAB — TSH RFX ON ABNORMAL TO FREE T4: TSH: 5.18 u[IU]/mL — ABNORMAL HIGH (ref 0.450–4.500)

## 2023-11-08 LAB — CBC WITH DIFFERENTIAL/PLATELET
Basophils Absolute: 0.1 10*3/uL (ref 0.0–0.2)
Basos: 1 %
EOS (ABSOLUTE): 0.1 10*3/uL (ref 0.0–0.4)
Eos: 1 %
Hematocrit: 38.4 % (ref 34.0–46.6)
Hemoglobin: 12.8 g/dL (ref 11.1–15.9)
Immature Grans (Abs): 0 10*3/uL (ref 0.0–0.1)
Immature Granulocytes: 0 %
Lymphocytes Absolute: 2.7 10*3/uL (ref 0.7–3.1)
Lymphs: 28 %
MCH: 31.1 pg (ref 26.6–33.0)
MCHC: 33.3 g/dL (ref 31.5–35.7)
MCV: 93 fL (ref 79–97)
Monocytes Absolute: 0.9 10*3/uL (ref 0.1–0.9)
Monocytes: 9 %
Neutrophils Absolute: 5.9 10*3/uL (ref 1.4–7.0)
Neutrophils: 61 %
Platelets: 355 10*3/uL (ref 150–450)
RBC: 4.12 x10E6/uL (ref 3.77–5.28)
RDW: 10.9 % — ABNORMAL LOW (ref 11.7–15.4)
WBC: 9.6 10*3/uL (ref 3.4–10.8)

## 2023-11-08 LAB — VITAMIN D 25 HYDROXY (VIT D DEFICIENCY, FRACTURES): Vit D, 25-Hydroxy: 13.7 ng/mL — ABNORMAL LOW (ref 30.0–100.0)

## 2023-11-08 LAB — HIV ANTIBODY (ROUTINE TESTING W REFLEX): HIV Screen 4th Generation wRfx: NONREACTIVE

## 2023-11-08 LAB — HEPATITIS C ANTIBODY: Hep C Virus Ab: NONREACTIVE

## 2023-11-08 LAB — LIPID PANEL
Chol/HDL Ratio: 2.5 {ratio} (ref 0.0–4.4)
Cholesterol, Total: 170 mg/dL (ref 100–199)
HDL: 67 mg/dL (ref >39)
LDL Chol Calc (NIH): 84 mg/dL (ref 0–99)
Triglycerides: 108 mg/dL (ref 0–149)
VLDL Cholesterol Cal: 19 mg/dL (ref 5–40)

## 2023-11-08 LAB — HEMOGLOBIN A1C
Est. average glucose Bld gHb Est-mCnc: 111 mg/dL
Hgb A1c MFr Bld: 5.5 % (ref 4.8–5.6)

## 2023-11-08 LAB — T4F: T4,Free (Direct): 1.12 ng/dL (ref 0.82–1.77)

## 2023-11-09 ENCOUNTER — Emergency Department (HOSPITAL_COMMUNITY)
Admission: EM | Admit: 2023-11-09 | Discharge: 2023-11-10 | Disposition: A | Discharge status: Home / Self Care | Payer: BC Managed Care – PPO | Attending: Emergency Medicine | Admitting: Emergency Medicine

## 2023-11-09 ENCOUNTER — Emergency Department (HOSPITAL_COMMUNITY): Payer: BC Managed Care – PPO

## 2023-11-09 ENCOUNTER — Encounter (HOSPITAL_COMMUNITY): Payer: Self-pay

## 2023-11-09 ENCOUNTER — Other Ambulatory Visit: Payer: Self-pay

## 2023-11-09 DIAGNOSIS — R202 Paresthesia of skin: Secondary | ICD-10-CM | POA: Diagnosis present

## 2023-11-09 LAB — COMPREHENSIVE METABOLIC PANEL
ALT: 19 U/L (ref 0–44)
AST: 21 U/L (ref 15–41)
Albumin: 4.7 g/dL (ref 3.5–5.0)
Alkaline Phosphatase: 77 U/L (ref 38–126)
Anion gap: 10 (ref 5–15)
BUN: 12 mg/dL (ref 6–20)
CO2: 22 mmol/L (ref 22–32)
Calcium: 8.9 mg/dL (ref 8.9–10.3)
Chloride: 100 mmol/L (ref 98–111)
Creatinine, Ser: 0.65 mg/dL (ref 0.44–1.00)
GFR, Estimated: >60 mL/min (ref >60)
Glucose, Bld: 96 mg/dL (ref 70–99)
Potassium: 3.5 mmol/L (ref 3.5–5.1)
Sodium: 132 mmol/L — ABNORMAL LOW (ref 135–145)
Total Bilirubin: 0.9 mg/dL (ref <1.2)
Total Protein: 7.9 g/dL (ref 6.5–8.1)

## 2023-11-09 LAB — CBC
HCT: 40.6 % (ref 36.0–46.0)
Hemoglobin: 13.6 g/dL (ref 12.0–15.0)
MCH: 31.5 pg (ref 26.0–34.0)
MCHC: 33.5 g/dL (ref 30.0–36.0)
MCV: 94 fL (ref 80.0–100.0)
Platelets: 351 10*3/uL (ref 150–400)
RBC: 4.32 MIL/uL (ref 3.87–5.11)
RDW: 11.6 % (ref 11.5–15.5)
WBC: 10 10*3/uL (ref 4.0–10.5)
nRBC: 0 % (ref 0.0–0.2)

## 2023-11-09 LAB — DIFFERENTIAL
Abs Immature Granulocytes: 0.04 10*3/uL (ref 0.00–0.07)
Basophils Absolute: 0.1 10*3/uL (ref 0.0–0.1)
Basophils Relative: 1 %
Eosinophils Absolute: 0 10*3/uL (ref 0.0–0.5)
Eosinophils Relative: 0 %
Immature Granulocytes: 0 %
Lymphocytes Relative: 26 %
Lymphs Abs: 2.6 10*3/uL (ref 0.7–4.0)
Monocytes Absolute: 0.8 10*3/uL (ref 0.1–1.0)
Monocytes Relative: 8 %
Neutro Abs: 6.5 10*3/uL (ref 1.7–7.7)
Neutrophils Relative %: 65 %

## 2023-11-09 LAB — APTT: aPTT: 26 s (ref 24–36)

## 2023-11-09 LAB — PROTIME-INR
INR: 1 (ref 0.8–1.2)
Prothrombin Time: 13.2 s (ref 11.4–15.2)

## 2023-11-09 LAB — ETHANOL: Alcohol, Ethyl (B): <10 mg/dL (ref <10)

## 2023-11-09 LAB — HCG, SERUM, QUALITATIVE: Preg, Serum: NEGATIVE

## 2023-11-09 NOTE — ED Triage Notes (Signed)
C/o left arm tingling and weakness from shoulder to fingertips that started at 1400.  Pt reports 1 hour PTA started having bilateral leg tingling and nausea.  Denies blood thinner usage.  Patient ambulatory to triage

## 2023-11-10 ENCOUNTER — Emergency Department (HOSPITAL_COMMUNITY): Payer: BC Managed Care – PPO

## 2023-11-10 LAB — URINALYSIS, ROUTINE W REFLEX MICROSCOPIC
Bilirubin Urine: NEGATIVE
Glucose, UA: NEGATIVE mg/dL
Ketones, ur: NEGATIVE mg/dL
Leukocytes,Ua: NEGATIVE
Nitrite: NEGATIVE
Protein, ur: NEGATIVE mg/dL
Specific Gravity, Urine: 1.01 (ref 1.005–1.030)
pH: 5 (ref 5.0–8.0)

## 2023-11-10 LAB — RAPID URINE DRUG SCREEN, HOSP PERFORMED
Amphetamines: NOT DETECTED
Barbiturates: NOT DETECTED
Benzodiazepines: NOT DETECTED
Cocaine: NOT DETECTED
Opiates: NOT DETECTED
Tetrahydrocannabinol: NOT DETECTED

## 2023-11-10 MED ORDER — ACETAMINOPHEN 325 MG PO TABS
650.0000 mg | ORAL_TABLET | Freq: Once | ORAL | Status: AC
  Administered 2023-11-10: 650 mg via ORAL
  Filled 2023-11-10: qty 2

## 2023-11-10 NOTE — ED Notes (Signed)
Patient transported to MRI 

## 2023-11-10 NOTE — ED Provider Notes (Signed)
[place] EMERGENCY DEPARTMENT AT [facility] Provider Note   CSN: [id number] Arrival date & time: 11/09/23  2232     History  Chief Complaint  Patient presents with   Numbness    [name] is a 42 y.o. female. Patient presents to the Emergency Department complaining of left arm weakness with paresthesias and bilateral foot paresthesias.  She states that symptoms began at approximately 2 PM.  She denies history of the same.  She denies blood thinner usage, trauma, headache, neck pain or stiffness, back pain, abdominal pain, nausea, vomiting, urinary symptoms, urinary incontinence, fecal incontinence, saddle anesthesia.  Past medical history significant for generalized anxiety disorder.  NIH upon arrival was 0 HPI     Home Medications Prior to Admission medications   Medication Sig Start Date End Date Taking? Authorizing Provider  sertraline (ZOLOFT) 50 MG tablet Take 1 tablet (50 mg total) by mouth at bedtime. 08/14/23  Yes [name], PA      Allergies    Garlic, Onion, and Shrimp (diagnostic)    Review of Systems   Review of Systems  Physical Exam Updated Vital Signs BP 122/75 (BP Location: Right Arm)   Pulse 64   Temp 98.2 F (36.8 C) (Oral)   Resp 16   Ht 5\' 1"  (1.549 m)   Wt 64.4 kg   LMP 11/04/2023   SpO2 100%   BMI 26.83 kg/m  Physical Exam Vitals and nursing note reviewed.  Constitutional:      General: She is not in acute distress.    Appearance: She is well-developed.  HENT:     Head: Normocephalic and atraumatic.  Eyes:     Conjunctiva/sclera: Conjunctivae normal.  Cardiovascular:     Rate and Rhythm: Normal rate and regular rhythm.     Heart sounds: No murmur heard. Pulmonary:     Effort: Pulmonary effort is normal. No respiratory distress.     Breath sounds: Normal breath sounds.  Abdominal:     Palpations: Abdomen is soft.     Tenderness: There is no abdominal tenderness.  Musculoskeletal:        General: No  swelling.     Cervical back: Neck supple.  Skin:    General: Skin is warm and dry.     Capillary Refill: Capillary refill takes less than 2 seconds.  Neurological:     General: No focal deficit present.     Mental Status: She is alert.     Motor: No weakness.     Comments: No objective findings.  Left hand strength grossly equal to right hand strength.  Patient with upper and lower extremity sensation grossly intact.  No weakness noted to bilateral lower extremities. Cranial nerves II through VII, XI, XII intact  Psychiatric:        Mood and Affect: Mood normal.     ED Results / Procedures / Treatments   Labs (all labs ordered are listed, but only abnormal results are displayed) Labs Reviewed  COMPREHENSIVE METABOLIC PANEL - Abnormal; Notable for the following components:      Result Value   Sodium 132 (*)    All other components within normal limits  URINALYSIS, ROUTINE W REFLEX MICROSCOPIC - Abnormal; Notable for the following components:   Color, Urine STRAW (*)    Hgb urine dipstick SMALL (*)    Bacteria, UA RARE (*)    All other components within normal limits  ETHANOL  PROTIME-INR  APTT  CBC  DIFFERENTIAL  RAPID  URINE DRUG SCREEN, HOSP PERFORMED  HCG, SERUM, QUALITATIVE    EKG EKG Interpretation Date/Time:  Friday November 09 2023 23:18:38 EST Ventricular Rate:  81 PR Interval:  135 QRS Duration:  96 QT Interval:  401 QTC Calculation: 466 R Axis:   38  Text Interpretation: Sinus rhythm Prominent P waves, nondiagnostic Abnormal inferior Q waves No significant change since last tracing Confirmed by Fulton Reek [id number]) on 11/10/2023 4:17:42 AM  Radiology MR BRAIN WO CONTRAST Result Date: 11/10/2023 CLINICAL DATA:  42 year old female with hand and foot numbness. Left hand weakness. EXAM: MRI HEAD WITHOUT CONTRAST TECHNIQUE: Multiplanar, multiecho pulse sequences of the brain and surrounding structures were obtained without intravenous contrast. COMPARISON:   Head CT yesterday. FINDINGS: Brain: Normal cerebral volume. No restricted diffusion to suggest acute infarction. No midline shift, mass effect, evidence of mass lesion, ventriculomegaly, extra-axial collection or acute intracranial hemorrhage. Cervicomedullary junction and pituitary are within normal limits. Wallace Cullens and white matter signal is within normal limits for age throughout the brain. No cerebral edema or encephalomalacia identified. No chronic cerebral blood products on SWI. Deep gray nuclei, brainstem and cerebellum appear negative. Vascular: Major intracranial vascular flow voids are preserved, with fetal type PCA origins suspected, diminutive vertebrobasilar system and highly diminutive visible right vertebral artery. Skull and upper cervical spine: Negative for age visible cervical spine, mild mostly anterior disc bulging at C5-C6. Visualized bone marrow signal is within normal limits. Sinuses/Orbits: Sphenoid and posterior ethmoid sinus mucosal thickening on the right side but other paranasal sinuses appear clear. Negative orbits soft tissues. Other: Mastoids are clear. Visible internal auditory structures appear normal. Negative visible scalp and face. IMPRESSION: 1. Normal noncontrast MRI appearance of the Brain. 2. Mild right sphenoid and posterior ethmoid sinus inflammation. Electronically Signed   By: Odessa Fleming M.D.   On: 11/10/2023 06:11   CT HEAD WO CONTRAST Result Date: 11/09/2023 CLINICAL DATA:  Initial evaluation for acute neuro deficit, stroke suspected, left upper extremity tingling and weakness. EXAM: CT HEAD WITHOUT CONTRAST TECHNIQUE: Contiguous axial images were obtained from the base of the skull through the vertex without intravenous contrast. RADIATION DOSE REDUCTION: This exam was performed according to the departmental dose-optimization program which includes automated exposure control, adjustment of the mA and/or kV according to patient size and/or use of iterative reconstruction  technique. COMPARISON:  None Available. FINDINGS: Brain: Cerebral volume within normal limits for patient age. No acute intracranial hemorrhage. No acute large vessel territory infarct. No mass lesion, midline shift, or mass effect. Ventricles are normal in size without hydrocephalus. No extra-axial fluid collection. Vascular: No abnormal hyperdense vessel. Skull: Scalp soft tissues demonstrate no acute abnormality. Calvarium intact. Sinuses/Orbits: Globes and orbital soft tissues within normal limits. Moderate mucosal thickening about the right sphenoid sinus. Paranasal sinuses are otherwise clear. No mastoid effusion. IMPRESSION: 1. Normal head CT.  No acute intracranial abnormality. 2. Right sphenoid sinusitis. Electronically Signed   By: Rise Mu M.D.   On: 11/09/2023 23:56    Procedures Procedures    Medications Ordered in ED Medications  acetaminophen (TYLENOL) tablet 650 mg (650 mg Oral Given 11/10/23 0442)    ED Course/ Medical Decision Making/ A&P                                 Medical Decision Making Amount and/or Complexity of Data Reviewed Labs: ordered. Radiology: ordered.  Risk OTC drugs.   This patient presents to  the ED for concern of paresthesias and weakness, this involves an extensive number of treatment options, and is a complaint that carries with it a high risk of complications and morbidity.  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  The pertinent results include: Grossly unremarkable  UA, UDS, negative pregnancy test, negative ethanol, CMP and CBC grossly unremarkable   Imaging Studies ordered:  I ordered imaging studies including CT head and MRI brain I independently visualized and interpreted imaging which showed no acute findings on CT head negative MRI brain I agree with the radiologist interpretation   Cardiac Monitoring: / EKG:  The patient was maintained on a cardiac monitor.  I personally viewed and interpreted the cardiac monitored which showed an underlying rhythm of: Sinus rhythm   Problem List / ED Course / Critical interventions / Medication management   I ordered medication including Tylenol for mild headache which developed while in ED Reevaluation of the patient after these medicines showed that the patient improved I have reviewed the patients home medicines and have made adjustments as needed   Test / Admission - Considered:  CT and MRI negative.  No objective signs of any neurodeficit.  Patient's sensation and strength grossly intact with normal gait.  No facial droop or slurred speech appreciated.  Plan to discharge home at this time with return precautions and referral outpatient to neurosurgery.  Patient voices understanding with plan.         Final Clinical Impression(s) / ED Diagnoses Final diagnoses:  Paresthesia    Rx / DC Orders ED Discharge Orders          Ordered    Ambulatory referral to Neurology       Comments: An appointment is requested in approximately: 2 weeks   11/10/23 0628              Darrick Grinder, PA-C 11/10/23 2952    Laurence Spates, MD 11/10/23 [phone]

## 2023-11-10 NOTE — Discharge Instructions (Addendum)
Please follow-up with neurology and your primary care office for further evaluation of the numbness that you are experiencing.

## 2023-11-10 NOTE — ED Notes (Signed)
Pt reporting bilateral hand tingling and feeling cold. Pt given warm packs for hands and reports improvement in symptoms.

## 2023-11-14 ENCOUNTER — Ambulatory Visit (INDEPENDENT_AMBULATORY_CARE_PROVIDER_SITE_OTHER): Payer: BC Managed Care – PPO | Admitting: Family Medicine

## 2023-11-14 VITALS — BP 114/71 | HR 73 | Resp 18 | Ht 61.0 in | Wt 142.0 lb

## 2023-11-14 DIAGNOSIS — F4310 Post-traumatic stress disorder, unspecified: Secondary | ICD-10-CM

## 2023-11-14 DIAGNOSIS — R7989 Other specified abnormal findings of blood chemistry: Secondary | ICD-10-CM

## 2023-11-14 DIAGNOSIS — Z0000 Encounter for general adult medical examination without abnormal findings: Secondary | ICD-10-CM

## 2023-11-14 DIAGNOSIS — H6990 Unspecified Eustachian tube disorder, unspecified ear: Secondary | ICD-10-CM | POA: Insufficient documentation

## 2023-11-14 DIAGNOSIS — R1310 Dysphagia, unspecified: Secondary | ICD-10-CM

## 2023-11-14 NOTE — Progress Notes (Signed)
Complete physical exam  Patient: [name]   DOB: 29-Mar-1981   42 y.o. Female  MRN: [id number]  Subjective:    Chief Complaint  Patient presents with   Annual Exam    [name] is a 42 y.o. female who presents today for a complete physical exam. She reports consuming a general diet. She reports sleeping fairly well. She also endorses 2-3 months of dysphagia occasionally.  She does have a referral that was placed to neurology after she presented to the emergency along with weakness and paresthesias in her left arm and bilateral legs.  She also notes that since her most recent trip to [place], she experiences intermittent sharp ear pain that lasts several seconds then subsides.   Most recent fall risk assessment:    01/26/2022    9:43 AM  Fall Risk   Falls in the past year? 0  Number falls in past yr: 0  Injury with Fall? 0  Follow up Falls evaluation completed     Most recent depression and anxiety screenings:    11/14/2023   11:01 AM 08/14/2023    3:04 PM  PHQ 2/9 Scores  PHQ - 2 Score 0 0  PHQ- 9 Score 2 2      11/14/2023   11:01 AM 08/14/2023    3:04 PM 04/20/2023   11:24 AM 02/16/2022   10:36 AM  GAD 7 : Generalized Anxiety Score  Nervous, Anxious, on Edge 0 1 2 1   Control/stop worrying 1 1 3 1   Worry too much - different things 1 1 3 1   Trouble relaxing 1 1 3  0  Restless 0 0 1 0  Easily annoyed or irritable 1 1 1 1   Afraid - awful might happen 2 2 3  0  Total GAD 7 Score 6 7 16 4   Anxiety Difficulty Not difficult at all Not difficult at all Very difficult     Patient Active Problem List   Diagnosis Date Noted   Eustachian tube dysfunction 11/14/2023   Tremor 08/14/2023   Post-traumatic stress 04/30/2023   Generalized anxiety disorder 04/30/2023   Overweight with body mass index (BMI) of 26 to 26.9 in adult 01/26/2022   Food allergy 01/26/2022    History reviewed. No pertinent surgical history. Social History   Tobacco Use   Smoking status:  Never    Passive exposure: Never   Smokeless tobacco: Never  Substance Use Topics   Alcohol use: Yes    Comment: occasional   Drug use: No   History reviewed. No pertinent family history. Allergies  Allergen Reactions   Garlic Nausea And Vomiting   Onion Nausea And Vomiting   Shrimp (Diagnostic) Itching     Patient Care Team: [name], PA as PCP - General (Family Medicine) Maxie Better, MD as Consulting Physician (Obstetrics and Gynecology)   Outpatient Medications Prior to Visit  Medication Sig   sertraline (ZOLOFT) 50 MG tablet Take 1 tablet (50 mg total) by mouth at bedtime.   No facility-administered medications prior to visit.    Review of Systems  Constitutional:  Negative for chills, fever and malaise/fatigue.  HENT:  Negative for congestion and hearing loss.        Difficulty swallowing  Eyes:  Negative for blurred vision and double vision.  Respiratory:  Negative for cough and shortness of breath.   Cardiovascular:  Negative for chest pain, palpitations and leg swelling.  Gastrointestinal:  Negative for abdominal pain, constipation, diarrhea and heartburn.  Genitourinary:  Negative  for frequency and urgency.  Musculoskeletal:  Negative for back pain, falls, myalgias and neck pain.  Neurological:  Positive for tingling and weakness. Negative for headaches.  Endo/Heme/Allergies:  Negative for polydipsia.  Psychiatric/Behavioral:  Negative for depression. The patient is not nervous/anxious.       Objective:    BP 114/71 (BP Location: Left Arm, Patient Position: Sitting, Cuff Size: Normal)   Pulse 73   Resp 18   Ht 5\' 1"  (1.549 m)   Wt 142 lb (64.4 kg)   LMP 10/29/2023 (Exact Date)   SpO2 97%   BMI 26.83 kg/m    Physical Exam Constitutional:      General: She is not in acute distress.    Appearance: Normal appearance.  HENT:     Head: Normocephalic and atraumatic.     Right Ear: Tympanic membrane, ear canal and external ear normal.      Left Ear: Tympanic membrane, ear canal and external ear normal.     Nose: Nose normal.     Mouth/Throat:     Mouth: Mucous membranes are moist.     Pharynx: No oropharyngeal exudate or posterior oropharyngeal erythema.  Eyes:     Extraocular Movements: Extraocular movements intact.     Conjunctiva/sclera: Conjunctivae normal.     Pupils: Pupils are equal, round, and reactive to light.  Neck:     Thyroid: No thyroid mass, thyromegaly or thyroid tenderness.  Cardiovascular:     Rate and Rhythm: Normal rate and regular rhythm.     Heart sounds: Normal heart sounds. No murmur heard.    No friction rub. No gallop.  Pulmonary:     Effort: Pulmonary effort is normal. No respiratory distress.     Breath sounds: Normal breath sounds. No wheezing, rhonchi or rales.  Abdominal:     General: Abdomen is flat. Bowel sounds are normal. There is no distension.     Palpations: There is no mass.     Tenderness: There is no abdominal tenderness. There is no guarding.  Musculoskeletal:        General: Normal range of motion.     Cervical back: Normal range of motion and neck supple.  Lymphadenopathy:     Cervical: No cervical adenopathy.  Skin:    General: Skin is warm and dry.  Neurological:     Mental Status: She is alert and oriented to person, place, and time.     Cranial Nerves: No cranial nerve deficit.     Motor: No weakness.     Deep Tendon Reflexes: Reflexes normal.  Psychiatric:        Mood and Affect: Mood normal.        Assessment & Plan:    Routine Health Maintenance and Physical Exam  Immunization History  Administered Date(s) Administered   Moderna Sars-Covid-2 Vaccination 03/21/2020, 04/18/2020   Tdap 05/11/2023    Health Maintenance  Topic Date Due   Cervical Cancer Screening (HPV/Pap Cotest)  Never done   COVID-19 Vaccine (3 - 2024-25 season) 11/30/2023 (Originally 07/29/2023)   INFLUENZA VACCINE  02/25/2024 (Originally 06/28/2023)   DTaP/Tdap/Td (2 - Td or Tdap)  05/10/2033   Hepatitis C Screening  Completed   HIV Screening  Completed   HPV VACCINES  Aged Out    Reviewed most recent labs including CBC, CMP, lipid panel, A1C, TSH. All within normal limits/stable from last check other than elevated TSH with normal T4. Recheck thyroid labs at follow up visit.  Patient also endorses 2 months of  dysphagia, ordering thyroid ultrasound. Pap smear was completed this year with OB/GYN.  Discussed health benefits of physical activity, and encouraged her to engage in regular exercise appropriate for her age and condition.  Wellness examination  Post-traumatic stress Assessment & Plan: Continue Zoloft 50 mg daily.  Will continue to monitor.   Elevated TSH -     US THYROID; Future -     TSH; Future -     T4, free; Future  Dysphagia, unspecified type -     US THYROID; Future  Dysfunction of Eustachian tube, unspecified laterality Assessment & Plan: Will continue to monitor, if needed can send referral to ENT in the future.   Given recent elevated TSH and new dysphagia, recheck thyroid labs in about 6 weeks and have thyroid ultrasound.  Return in about 2 months (around 01/15/2024) for follow-up for thyroid, review [place] and lab results.     [name], PA

## 2023-11-14 NOTE — Assessment & Plan Note (Signed)
Will continue to monitor, if needed can send referral to ENT in the future.

## 2023-11-14 NOTE — Assessment & Plan Note (Signed)
Continue Zoloft 50 mg daily.  Will continue to monitor.

## 2023-11-22 ENCOUNTER — Ambulatory Visit
Admission: RE | Admit: 2023-11-22 | Discharge: 2023-11-22 | Disposition: A | Discharge status: Home / Self Care | Payer: BC Managed Care – PPO | Source: Ambulatory Visit | Attending: Family Medicine | Admitting: Family Medicine

## 2023-11-22 ENCOUNTER — Encounter: Payer: Self-pay | Admitting: Family Medicine

## 2023-11-22 DIAGNOSIS — R1310 Dysphagia, unspecified: Secondary | ICD-10-CM

## 2023-11-22 DIAGNOSIS — R7989 Other specified abnormal findings of blood chemistry: Secondary | ICD-10-CM

## 2024-01-03 ENCOUNTER — Encounter: Payer: Self-pay | Admitting: Family Medicine

## 2024-01-15 ENCOUNTER — Ambulatory Visit: Payer: BC Managed Care – PPO | Admitting: Family Medicine

## 2024-02-21 ENCOUNTER — Other Ambulatory Visit: Payer: Self-pay | Admitting: Family Medicine

## 2024-02-21 DIAGNOSIS — F4310 Post-traumatic stress disorder, unspecified: Secondary | ICD-10-CM

## 2024-02-22 ENCOUNTER — Ambulatory Visit: Payer: BC Managed Care – PPO | Admitting: Diagnostic Neuroimaging

## 2024-02-27 ENCOUNTER — Ambulatory Visit (INDEPENDENT_AMBULATORY_CARE_PROVIDER_SITE_OTHER): Admitting: Diagnostic Neuroimaging

## 2024-02-27 ENCOUNTER — Encounter: Payer: Self-pay | Admitting: Diagnostic Neuroimaging

## 2024-02-27 VITALS — BP 118/74 | HR 61 | Ht 61.0 in | Wt 150.0 lb

## 2024-02-27 DIAGNOSIS — R251 Tremor, unspecified: Secondary | ICD-10-CM | POA: Diagnosis not present

## 2024-02-27 DIAGNOSIS — R202 Paresthesia of skin: Secondary | ICD-10-CM | POA: Diagnosis not present

## 2024-02-27 DIAGNOSIS — R5383 Other fatigue: Secondary | ICD-10-CM | POA: Diagnosis not present

## 2024-02-27 DIAGNOSIS — R200 Anesthesia of skin: Secondary | ICD-10-CM | POA: Diagnosis not present

## 2024-02-27 NOTE — Progress Notes (Signed)
 [facility]  PATIENT: [name] DOB: 09-Feb-1981  REFERRING CLINICIAN: Darrick Grinder, PA-C HISTORY FROM: patient  REASON FOR VISIT: new consult   HISTORICAL  CHIEF COMPLAINT:  Chief Complaint  Patient presents with   New Patient (Initial Visit)    Patient in room #6 and alone. Patient states she been having tingling and numbness in both legs and hands. Patient states she been having tremors in her hands.    HISTORY OF PRESENT ILLNESS:   43 year old female here for evaluation of numbness and tingling.  Symptoms started in December 2024.  Initially she had left arm and bilateral foot numbness sensation.  Initially this was quite severe and tingling.  Over several weeks this increased to bilateral hands and bilateral feet involvement.  Symptoms plateaued and the slightly reduced since December.  No significant neck pain.  No symptoms in her face, vision, speech or swallowing.  Has been having some more fatigue.  Was under increased rest in August 2024 but this has improved.   REVIEW OF SYSTEMS: Full 14 system review of systems performed and negative with exception of: as per HPI.  ALLERGIES: Allergies  Allergen Reactions   Garlic Nausea And Vomiting   Onion Nausea And Vomiting   Shrimp (Diagnostic) Itching    HOME MEDICATIONS: Outpatient Medications Prior to Visit  Medication Sig Dispense Refill   sertraline (ZOLOFT) 50 MG tablet TAKE 1 TABLET BY MOUTH EVERYDAY AT BEDTIME 90 tablet 1   No facility-administered medications prior to visit.    PAST MEDICAL HISTORY: History reviewed. No pertinent past medical history.  PAST SURGICAL HISTORY: History reviewed. No pertinent surgical history.  FAMILY HISTORY: History reviewed. No pertinent family history.  SOCIAL HISTORY: Social History   Socioeconomic History   Marital status: Single    Spouse name: Not on file   Number of children: Not on file   Years of education: Not on file   Highest  education level: Associate degree: occupational, Scientist, product/process development, or vocational program  Occupational History   Not on file  Tobacco Use   Smoking status: Never    Passive exposure: Never   Smokeless tobacco: Never  Substance and Sexual Activity   Alcohol use: Yes    Comment: occasional   Drug use: No   Sexual activity: Yes    Birth control/protection: Condom  Other Topics Concern   Not on file  Social History Narrative   Not on file   Social Drivers of Health   Financial Resource Strain: Low Risk  (11/14/2023)   Overall Financial Resource Strain (CARDIA)    Difficulty of Paying Living Expenses: Not hard at all  Food Insecurity: Unknown (11/14/2023)   Hunger Vital Sign    Worried About Running Out of Food in the Last Year: Patient declined    Ran Out of Food in the Last Year: Never true  Transportation Needs: No Transportation Needs (11/14/2023)   PRAPARE - Administrator, Civil Service (Medical): No    Lack of Transportation (Non-Medical): No  Physical Activity: Insufficiently Active (11/14/2023)   Exercise Vital Sign    Days of Exercise per Week: 2 days    Minutes of Exercise per Session: 30 min  Stress: Stress Concern Present (11/14/2023)   [employer] of Occupational Health - Occupational Stress Questionnaire    Feeling of Stress : To some extent  Social Connections: Unknown (11/14/2023)   Social Connection and Isolation Panel [NHANES]    Frequency of Communication with Friends and Family: Once a  week    Frequency of Social Gatherings with Friends and Family: Once a week    Attends Religious Services: Patient declined    Database administrator or Organizations: No    Attends Engineer, structural: Not on file    Marital Status: Patient declined  Intimate Partner Violence: Not on file     PHYSICAL EXAM  GENERAL EXAM/CONSTITUTIONAL: Vitals:  Vitals:   02/27/24 1104  BP: 118/74  Pulse: 61  Weight: 150 lb (68 kg)  Height: 5\' 1"  (1.549 m)    Body mass index is 28.34 kg/m. Wt Readings from Last 3 Encounters:  02/27/24 150 lb (68 kg)  11/14/23 142 lb (64.4 kg)  11/09/23 142 lb (64.4 kg)   Patient is in no distress; well developed, nourished and groomed; neck is supple  CARDIOVASCULAR: Examination of carotid arteries is normal; no carotid bruits Regular rate and rhythm, no murmurs Examination of peripheral vascular system by observation and palpation is normal  EYES: Ophthalmoscopic exam of optic discs and posterior segments is normal; no papilledema or hemorrhages No results found.  MUSCULOSKELETAL: Gait, strength, tone, movements noted in Neurologic exam below  NEUROLOGIC: MENTAL STATUS:      No data to display         awake, alert, oriented to person, place and time recent and remote memory intact normal attention and concentration language fluent, comprehension intact, naming intact fund of knowledge appropriate  CRANIAL NERVE:  2nd - no papilledema on fundoscopic exam 2nd, 3rd, 4th, 6th - pupils equal and reactive to light, visual fields full to confrontation, extraocular muscles intact, no nystagmus 5th - facial sensation symmetric 7th - facial strength symmetric 8th - hearing intact 9th - palate elevates symmetrically, uvula midline 11th - shoulder shrug symmetric 12th - tongue protrusion midline  MOTOR:  normal bulk and tone, full strength in the BUE, BLE  SENSORY:  normal and symmetric to light touch, pinprick, temperature, vibration; EXCEPT DECR PP IN HANDS AND FEET  COORDINATION:  finger-nose-finger, fine finger movements normal  REFLEXES:  deep tendon reflexes 1+ and symmetric  GAIT/STATION:  narrow based gait     DIAGNOSTIC DATA (LABS, IMAGING, TESTING) - I reviewed patient records, labs, notes, testing and imaging myself where available.  Lab Results  Component Value Date   WBC 10.0 11/09/2023   HGB 13.6 11/09/2023   HCT 40.6 11/09/2023   MCV 94.0 11/09/2023   PLT 351  11/09/2023      Component Value Date/Time   NA 132 (L) 11/09/2023 2311   NA 141 11/07/2023 0911   K 3.5 11/09/2023 2311   CL 100 11/09/2023 2311   CO2 22 11/09/2023 2311   GLUCOSE 96 11/09/2023 2311   BUN 12 11/09/2023 2311   BUN 13 11/07/2023 0911   CREATININE 0.65 11/09/2023 2311   CALCIUM 8.9 11/09/2023 2311   PROT 7.9 11/09/2023 2311   PROT 6.8 11/07/2023 0911   ALBUMIN 4.7 11/09/2023 2311   ALBUMIN 4.6 11/07/2023 0911   AST 21 11/09/2023 2311   ALT 19 11/09/2023 2311   ALKPHOS 77 11/09/2023 2311   BILITOT 0.9 11/09/2023 2311   BILITOT 0.3 11/07/2023 0911   GFRNONAA >60 11/09/2023 2311   GFRAA >60 08/06/2019 1258   Lab Results  Component Value Date   CHOL 170 11/07/2023   HDL 67 11/07/2023   LDLCALC 84 11/07/2023   TRIG 108 11/07/2023   CHOLHDL 2.5 11/07/2023   Lab Results  Component Value Date   HGBA1C 5.5 11/07/2023  No results found for: "VITAMINB12" Lab Results  Component Value Date   TSH 5.180 (H) 11/07/2023   Component Ref Range & Units (hover) 3 mo ago  Hep C Virus Ab Non Reactive      Component Ref Range & Units (hover) 3 mo ago  HIV Screen 4th Generation wRfx Non Reactive      11/10/23 MRI brain 1. Normal noncontrast MRI appearance of the Brain. 2. Mild right sphenoid and posterior ethmoid sinus inflammation.   ASSESSMENT AND PLAN  43 y.o. year old female here with:   Dx:  1. Numbness and tingling   2. Tremor   3. Other fatigue      PLAN:  NUMBNESS / TINGLING (hands and feet; onset in Dec 2024; initially more severe, now slightly improved / plateaued; MRI brain is normal) - check neuropathy labs  - check MRI cervical spine; then consider EMG/NCS  Orders Placed This Encounter  Procedures   MR CERVICAL SPINE W WO CONTRAST   Vitamin B12   SPEP with IFE   ANA w/Reflex   SSA, SSB   Copper, Serum   Ceruloplasmin   Return for pending if symptoms worsen or fail to improve, pending test results.    Suanne Marker,  MD 02/27/2024, 11:43 AM Certified in Neurology, Neurophysiology and Neuroimaging  [facility] [address], [address] [place], [place] [address] [phone]

## 2024-02-27 NOTE — Patient Instructions (Signed)
 NUMBNESS / TINGLING (hands and feet; onset in Dec 2024; initially more severe, now slightly improved / plateaued) - check neuropathy labs  - check MRI cervical spine; then consider EMG/NCS

## 2024-02-28 ENCOUNTER — Ambulatory Visit: Admitting: Neurology

## 2024-03-03 LAB — COPPER, SERUM: Copper: 93 ug/dL (ref 80–158)

## 2024-03-03 LAB — MULTIPLE MYELOMA PANEL, SERUM
Albumin SerPl Elph-Mcnc: 4.1 g/dL (ref 2.9–4.4)
Albumin/Glob SerPl: 1.4 (ref 0.7–1.7)
Alpha 1: 0.3 g/dL (ref 0.0–0.4)
Alpha2 Glob SerPl Elph-Mcnc: 0.6 g/dL (ref 0.4–1.0)
B-Globulin SerPl Elph-Mcnc: 1 g/dL (ref 0.7–1.3)
Gamma Glob SerPl Elph-Mcnc: 1.2 g/dL (ref 0.4–1.8)
Globulin, Total: 3.1 g/dL (ref 2.2–3.9)
IgA/Immunoglobulin A, Serum: 166 mg/dL (ref 87–352)
IgG (Immunoglobin G), Serum: 1263 mg/dL (ref 586–1602)
IgM (Immunoglobulin M), Srm: 59 mg/dL (ref 26–217)
Total Protein: 7.2 g/dL (ref 6.0–8.5)

## 2024-03-03 LAB — VITAMIN B12: Vitamin B-12: 519 pg/mL (ref 232–1245)

## 2024-03-03 LAB — CERULOPLASMIN: Ceruloplasmin: 24.6 mg/dL (ref 19.0–39.0)

## 2024-03-03 LAB — SJOGREN'S SYNDROME ANTIBODS(SSA + SSB)
ENA SSA (RO) Ab: <0.2 AI (ref 0.0–0.9)
ENA SSB (LA) Ab: <0.2 AI (ref 0.0–0.9)

## 2024-03-04 ENCOUNTER — Encounter: Payer: Self-pay | Admitting: Diagnostic Neuroimaging

## 2024-03-04 LAB — ANA W/REFLEX: ANA Titer 1: NEGATIVE

## 2024-03-06 ENCOUNTER — Ambulatory Visit: Admitting: Neurology

## 2024-03-18 ENCOUNTER — Ambulatory Visit

## 2024-03-18 ENCOUNTER — Encounter: Payer: Self-pay | Admitting: Diagnostic Neuroimaging

## 2024-03-18 DIAGNOSIS — R202 Paresthesia of skin: Secondary | ICD-10-CM

## 2024-03-18 DIAGNOSIS — R251 Tremor, unspecified: Secondary | ICD-10-CM | POA: Diagnosis not present

## 2024-03-18 DIAGNOSIS — R200 Anesthesia of skin: Secondary | ICD-10-CM | POA: Diagnosis not present

## 2024-03-18 DIAGNOSIS — R5383 Other fatigue: Secondary | ICD-10-CM | POA: Diagnosis not present

## 2024-03-18 MED ORDER — GADOBENATE DIMEGLUMINE 529 MG/ML IV SOLN
15.0000 mL | Freq: Once | INTRAVENOUS | Status: AC | PRN
  Administered 2024-03-18: 15 mL via INTRAVENOUS

## 2024-07-16 ENCOUNTER — Encounter: Payer: Self-pay | Admitting: Family Medicine

## 2024-07-16 ENCOUNTER — Emergency Department (HOSPITAL_COMMUNITY)
Admission: EM | Admit: 2024-07-16 | Discharge: 2024-07-17 | Disposition: A | Discharge status: Home / Self Care | Attending: Emergency Medicine | Admitting: Emergency Medicine

## 2024-07-16 ENCOUNTER — Encounter (HOSPITAL_COMMUNITY): Payer: Self-pay | Admitting: Emergency Medicine

## 2024-07-16 ENCOUNTER — Other Ambulatory Visit: Payer: Self-pay

## 2024-07-16 ENCOUNTER — Emergency Department (HOSPITAL_COMMUNITY)

## 2024-07-16 DIAGNOSIS — M7989 Other specified soft tissue disorders: Secondary | ICD-10-CM | POA: Diagnosis not present

## 2024-07-16 DIAGNOSIS — J029 Acute pharyngitis, unspecified: Secondary | ICD-10-CM | POA: Diagnosis present

## 2024-07-16 DIAGNOSIS — R590 Localized enlarged lymph nodes: Secondary | ICD-10-CM | POA: Diagnosis not present

## 2024-07-16 DIAGNOSIS — M25571 Pain in right ankle and joints of right foot: Secondary | ICD-10-CM | POA: Diagnosis not present

## 2024-07-16 LAB — RESP PANEL BY RT-PCR (RSV, FLU A&B, COVID)  RVPGX2
Influenza A by PCR: NEGATIVE
Influenza B by PCR: NEGATIVE
Resp Syncytial Virus by PCR: NEGATIVE
SARS Coronavirus 2 by RT PCR: NEGATIVE

## 2024-07-16 LAB — GROUP A STREP BY PCR: Group A Strep by PCR: NOT DETECTED

## 2024-07-16 NOTE — ED Provider Triage Note (Addendum)
 Emergency Medicine Provider Triage Evaluation Note  [name] , a 43 y.o. female  was evaluated in triage.  Pt complains of odynophagia x 4 days with globus sensation. Sick contact at home, son with cough and congestion. Also saying she has R ear pain.   Separately has R achilles tendon pain and ankle pain x 5 days. Denies injury to area.   LMP 1 week ago.  Endorses intermittent headache Denies fever, cough, tinnitus, vertigo, congestion, shortness of breath, chest pain, n/v/d, dysuria, vaginal discharge.   Review of Systems  Positive: N/a Negative: N/a  Physical Exam  BP 128/79 (BP Location: Left Arm)   Pulse 70   Temp 98.6 F (37 C) (Oral)   Resp 17   SpO2 98%  Gen:   Awake, no distress   Resp:  Normal effort  MSK:   Moves extremities without difficulty  Other:    Medical Decision Making  Medically screening exam initiated at 9:39 PM.  Appropriate orders placed.  Kayli Beal was informed that the remainder of the evaluation will be completed by another provider, this initial triage assessment does not replace that evaluation, and the importance of remaining in the ED until their evaluation is complete.     Beola Terrall RAMAN, [place] 07/16/24 2141    Beola Terrall RAMAN, PA-C 07/16/24 2141    Beola Terrall RAMAN, [place] 07/16/24 2142

## 2024-07-16 NOTE — ED Triage Notes (Signed)
 Pt c/o sore throat, intermittent right ear pain, and right ankle pain since Sunday. Denies injury.

## 2024-07-17 ENCOUNTER — Ambulatory Visit

## 2024-07-17 NOTE — ED Provider Notes (Signed)
 [facility] Emergency Department Provider Note MRN:  [id number]  Arrival date & time: 07/17/24     Chief Complaint   Sore Throat and Ankle Pain   History of Present Illness   [name] is a 43 y.o. year-old female presents to the ED with chief complaint of sore throat. Onset was Saturday.  States that it feels dry and painful.  Denies any fever.  Denies cough.  Has tried Tylenol  without much relief.    Also complains or right ankle pain that started about 5 days ago. Reports having had some swelling.  Reports TTP.  States that it is worse when she walks.  Denies any injuries.  Had been recently wearing different shoes and had been traveling in [place].  History provided by patient.   Review of Systems  Pertinent positive and negative review of systems noted in HPI.    Physical Exam   Vitals:   07/16/24 2123 07/17/24 0046  BP: 128/79 129/83  Pulse: 70 65  Resp: 17 18  Temp: 98.6 F (37 C) 98.3 F (36.8 C)  SpO2: 98% 100%    CONSTITUTIONAL:  non toxic-appearing, NAD NEURO:  Alert and oriented x 3, CN 3-12 grossly intact EYES:  eyes equal and reactive ENT/NECK:  Supple, no stridor, oropharynx is mildly erythematous, no tonsillar exudate, no abscess, normal voice CARDIO:  normal rate, regular rhythm, appears well-perfused  PULM:  No respiratory distress, CTAB GI/GU:  non-distended,  MSK/SPINE:  No gross deformities, no edema, moves all extremities, mild swelling to right lateral ankle, minimal tenderness SKIN:  no rash, atraumatic   *Additional and/or pertinent findings included in MDM below  Diagnostic and Interventional Summary    EKG Interpretation Date/Time:    Ventricular Rate:    PR Interval:    QRS Duration:    QT Interval:    QTC Calculation:   R Axis:      Text Interpretation:         Labs Reviewed  GROUP A STREP BY PCR  RESP PANEL BY RT-PCR (RSV, FLU A&B, COVID)  [id number]    DG Ankle Complete Right  Final Result       Medications - No data to display   Procedures  /  Critical Care Procedures  ED Course and Medical Decision Making  I have reviewed the triage vital signs, the nursing notes, and pertinent available records from the EMR.  Social Determinants Affecting Complexity of Care: Patient has no clinically significant social determinants affecting this chief complaint..   ED Course:    Medical Decision Making Pt afebrile without tonsillar exudate, negative strep. Presents with mild cervical lymphadenopathy, & dysphagia; diagnosis of viral pharyngitis. No abx indicated. DC w symptomatic tx for pain  Pt does not appear dehydrated, but did discuss importance of water rehydration. Presentation non concerning for PTA or infxn spread to soft tissue. No trismus or uvula deviation. Specific return precautions discussed. Pt able to drink water in ED without difficulty with intact air way. Recommended PCP follow up.   Patient also complaining of right ankle pain.  She has some mild swelling laterally and some tenderness over the Achilles.  I suspect that she likely has some aspect of overuse injury from the walking and different shoes that she had been wearing while traveling.  Will give an ankle ASO for comfort.  Recommend ice, Tylenol , ibuprofen .  Amount and/or Complexity of Data Reviewed Radiology: ordered.         Consultants: No consultations were  needed in caring for this patient.   Treatment and Plan: Emergency department workup does not suggest an emergent condition requiring admission or immediate intervention beyond  what has been performed at this time. The patient is safe for discharge and has  been instructed to return immediately for worsening symptoms, change in  symptoms or any other concerns    Final Clinical Impressions(s) / ED Diagnoses     ICD-10-CM   1. Pharyngitis, unspecified etiology  J02.9     2. Acute right ankle pain  M25.571       ED Discharge Orders      None         Discharge Instructions Discussed with and Provided to Patient:   Discharge Instructions   None      Vicky Charleston, PA-C 07/17/24 9795    Theadore Ozell HERO, MD 07/17/24 [phone]

## 2024-11-23 ENCOUNTER — Encounter (HOSPITAL_COMMUNITY): Payer: Self-pay

## 2024-11-23 ENCOUNTER — Ambulatory Visit (HOSPITAL_COMMUNITY)

## 2024-11-23 ENCOUNTER — Ambulatory Visit (HOSPITAL_COMMUNITY)
Admission: RE | Admit: 2024-11-23 | Discharge: 2024-11-23 | Disposition: A | Discharge status: Home / Self Care | Payer: Self-pay | Source: Ambulatory Visit | Attending: Physician Assistant | Admitting: Physician Assistant

## 2024-11-23 VITALS — BP 124/69 | HR 74 | Temp 98.2°F | Resp 17 | Ht 61.0 in

## 2024-11-23 DIAGNOSIS — S6710XA Crushing injury of unspecified finger(s), initial encounter: Secondary | ICD-10-CM

## 2024-11-23 DIAGNOSIS — M79644 Pain in right finger(s): Secondary | ICD-10-CM | POA: Diagnosis not present

## 2024-11-23 MED ORDER — IBUPROFEN 600 MG PO TABS: 600.0000 mg | ORAL_TABLET | Freq: Three times a day (TID) | ORAL | 0 refills | Status: AC | PRN

## 2024-11-23 NOTE — ED Provider Notes (Signed)
 " [facility]    CSN: [id number] Arrival date & time: 11/23/24  1858      History   Chief Complaint Chief Complaint  Patient presents with   Finger Injury   Appointment    HPI [name] is a 43 y.o. female.   Patient presents today with a several week history of right thumb pain.  Reports that about 2 weeks ago she put her hand out to prevent the screen door from slamming and it caught her right thumb.  She has had ongoing pain since that time.  She reports minimal pain at rest but increases significantly at both metacarpal joint and interphalangeal joint of the right thumb whenever she extends or abducts the thumb.  She does have full range of motion though this is with pain.  She has tried ice but continues to have discomfort.  She reports that the pain is rated 8 on a 0-10 pain scale, described as throbbing, no alleviating factors identified.  She does report some numbness in the thumb and extending into her forefinger but denies any history of carpal tunnel.  She has not seen a specialist.  She is left-handed.  She has no concern for pregnancy.    History reviewed. No pertinent past medical history.  Patient Active Problem List   Diagnosis Date Noted   Eustachian tube dysfunction 11/14/2023   Tremor 08/14/2023   Post-traumatic stress 04/30/2023   Generalized anxiety disorder 04/30/2023   Overweight with body mass index (BMI) of 26 to 26.9 in adult 01/26/2022   Food allergy  01/26/2022    History reviewed. No pertinent surgical history.  OB History   No obstetric history on file.      Home Medications    Prior to Admission medications  Medication Sig Start Date End Date Taking? Authorizing Provider  ibuprofen  (ADVIL ) 600 MG tablet Take 1 tablet (600 mg total) by mouth every 8 (eight) hours as needed. 11/23/24  Yes Sherial Ebrahim K, PA-C  sertraline  (ZOLOFT ) 50 MG tablet TAKE 1 TABLET BY MOUTH EVERYDAY AT BEDTIME 02/22/24  Yes Chandra Toribio POUR, MD     Family History History reviewed. No pertinent family history.  Social History Social History[1]   Allergies   Garlic, Onion, and Shrimp (diagnostic)   Review of Systems Review of Systems  Constitutional:  Positive for activity change. Negative for appetite change, fatigue and fever.  Musculoskeletal:  Positive for arthralgias. Negative for myalgias.  Skin:  Negative for color change and wound.  Neurological:  Positive for numbness. Negative for weakness.     Physical Exam Triage Vital Signs ED Triage Vitals  Encounter Vitals Group     BP 11/23/24 1925 124/69     Girls Systolic BP Percentile --      Girls Diastolic BP Percentile --      Boys Systolic BP Percentile --      Boys Diastolic BP Percentile --      Pulse Rate 11/23/24 1925 74     Resp 11/23/24 1925 17     Temp 11/23/24 1925 98.2 F (36.8 C)     Temp Source 11/23/24 1925 Oral     SpO2 11/23/24 1925 96 %     Weight --      Height 11/23/24 1925 5' 1 (1.549 m)     Head Circumference --      Peak Flow --      Pain Score 11/23/24 1924 8     Pain Loc --  Pain Education --      Exclude from Growth Chart --    No data found.  Updated Vital Signs BP 124/69 (BP Location: Left Arm)   Pulse 74   Temp 98.2 F (36.8 C) (Oral)   Resp 17   Ht 5' 1 (1.549 m)   LMP 11/21/2024 (Within Days)   SpO2 96%   BMI 28.34 kg/m   Visual Acuity Right Eye Distance:   Left Eye Distance:   Bilateral Distance:    Right Eye Near:   Left Eye Near:    Bilateral Near:     Physical Exam Vitals reviewed.  Constitutional:      General: She is awake. She is not in acute distress.    Appearance: Normal appearance. She is well-developed. She is not ill-appearing.     Comments: Very pleasant female appears stated age in no acute distress sitting comfortable in exam room  HENT:     Head: Normocephalic and atraumatic.  Cardiovascular:     Rate and Rhythm: Normal rate and regular rhythm.     Heart sounds: Normal heart  sounds, S1 normal and S2 normal. No murmur heard.    Comments: Capillary fill within 2 seconds right fingers Pulmonary:     Effort: Pulmonary effort is normal.     Breath sounds: Normal breath sounds. No wheezing, rhonchi or rales.     Comments: Clear to auscultation bilaterally Musculoskeletal:     Right hand: Tenderness present. No swelling or bony tenderness. Decreased range of motion. There is no disruption of two-point discrimination. Normal capillary refill.     Comments: Right hand: Tenderness palpation over proximal right thumb without deformity.  Normal active range of motion but she does have significant pain with extension and abduction of the thumb.  Hand is neurovascularly intact with normal 2-point discrimination.  Normal pincer grip strength.  Psychiatric:        Behavior: Behavior is cooperative.      UC Treatments / Results  Labs (all labs ordered are listed, but only abnormal results are displayed) Labs Reviewed - No data to display  EKG   Radiology DG Hand Complete Right Result Date: 11/23/2024 EXAM: 3 OR MORE VIEW(S) XRAY OF THE HAND 11/23/2024 07:33:40 PM COMPARISON: None available. CLINICAL HISTORY: injury, swelling, reduced movement FINDINGS: BONES AND JOINTS: No acute fracture. No malalignment. SOFT TISSUES: The soft tissues are unremarkable. IMPRESSION: 1. No acute fracture or dislocation. Electronically signed by: Dorethia Molt MD 11/23/2024 07:45 PM EST RP Workstation: [id number]    Procedures Procedures (including critical care time)  Medications Ordered in UC Medications - No data to display  Initial Impression / Assessment and Plan / UC Course  I have reviewed the triage vital signs and the nursing notes.  Pertinent labs & imaging results that were available during my care of the patient were reviewed by me and considered in my medical decision making (see chart for details).     Patient is well-appearing, afebrile, nontoxic, nontachycardic.   Hand is neurovascularly intact.  X-ray was obtained given mechanism of injury that showed no acute osseous abnormality.  She was encouraged to continue with RICE protocol and placed in a thumb spica splint (DME) to help with comfort and support.  Will start ibuprofen  600 mg 3 times daily to help with pain and inflammation.  Notification for dose adjustment based on metabolic panel from 11/09/2023 with a creatinine of 0.65 and That her creatinine clearance of 122 mL/min.  She can alternate this with  Tylenol  for additional pain relief.  We discussed that if her symptoms are not improving within a few days with conservative treatment measures that she should follow-up with hand specialist and was given the contact information with instruction to call to schedule appointment as needed.  We discussed that if anything worsens and she has increasing pain, discoloration, decreased active motion, numbness or paresthesias she is to be seen emergently.  Return precautions given.  Excuse note provided.  Final Clinical Impressions(s) / UC Diagnoses   Final diagnoses:  Crushing injury of finger, initial encounter  Pain of right thumb     Discharge Instructions      Your x-ray did not show anything broken or out of place which is great news.  As we discussed, I think that you have injured some of the soft tissue which is causing swelling and ongoing pain.  Try to keep this elevated and apply ice for 15 minutes at a time 3-4 times a day.  Use the splint for comfort and support.  Take ibuprofen  600 mg up to 3 times a day.  Do not take NSAIDs with this medication including aspirin, ibuprofen /Advil , naproxen /Aleve .  Use Tylenol /acetaminophen  for breakthrough pain.  If your symptoms are not improving within a few days please follow-up with hand specialist; call to schedule an appointment.  If anything worsens you have increasing pain, numbness or tingling that is worsening, difficulty moving your finger, discoloration of  the finger you need to be seen immediately.     ED Prescriptions     Medication Sig Dispense Auth. Provider   ibuprofen  (ADVIL ) 600 MG tablet Take 1 tablet (600 mg total) by mouth every 8 (eight) hours as needed. 21 tablet Maicie Vanderloop K, PA-C      PDMP not reviewed this encounter.    [1]  Social History Tobacco Use   Smoking status: Never    Passive exposure: Never   Smokeless tobacco: Never  Substance Use Topics   Alcohol use: Yes    Comment: occasional   Drug use: No     Sherrell Rocky POUR, PA-C 11/23/24 2010  "

## 2024-11-23 NOTE — Discharge Instructions (Signed)
 Your x-ray did not show anything broken or out of place which is great news.  As we discussed, I think that you have injured some of the soft tissue which is causing swelling and ongoing pain.  Try to keep this elevated and apply ice for 15 minutes at a time 3-4 times a day.  Use the splint for comfort and support.  Take ibuprofen  600 mg up to 3 times a day.  Do not take NSAIDs with this medication including aspirin, ibuprofen /Advil , naproxen /Aleve .  Use Tylenol /acetaminophen  for breakthrough pain.  If your symptoms are not improving within a few days please follow-up with hand specialist; call to schedule an appointment.  If anything worsens you have increasing pain, numbness or tingling that is worsening, difficulty moving your finger, discoloration of the finger you need to be seen immediately.

## 2024-11-23 NOTE — ED Triage Notes (Signed)
 Thumb hurts, numbness and pain - Entered by patient  Right thumb pain. States she was trying to catch the screen door and it slammed onto the thumb onset 1-2 weeks ago. Patient having trouble moving the thumb and the area is swollen.   Patient tried Ice with no relief. Has not taken anything for pain.

## 2025-01-01 ENCOUNTER — Ambulatory Visit: Payer: Self-pay

## 2025-01-01 NOTE — Telephone Encounter (Signed)
 Set patient a MyChart message

## 2025-01-01 NOTE — Telephone Encounter (Signed)
 FYI Only or Action Required?: FYI only for provider: ED advised and ED declined.  Patient was last seen in primary care on 11/14/2023 by Beth Joesph LABOR, PA.  Called Nurse Triage reporting Breathing Problem.  Symptoms began several days ago.  Interventions attempted: Nothing.  Symptoms are: unchanged.  Triage Disposition: Go to ED Now (Notify PCP)  Patient/caregiver understands and will follow disposition?: No, wishes to speak with PCP   Message from [facility] D sent at 01/01/2025  2:43 PM EST  Pt is having shortness of breathe started 4 days ago.   Reason for Disposition  [1] MODERATE difficulty breathing (e.g., speaks in phrases, SOB even at rest, pulse 100-120) AND [2] NEW-onset or WORSE than normal  Answer Assessment - Initial Assessment Questions Advised ED now. Patient declines and reports will go to UC.  Advised call back or ED/911 if symptoms occur/worsen: severe diff breathing, chest pain > 5 min, faint. Patient verbalized understanding.   1. RESPIRATORY STATUS: Describe your breathing? (e.g., wheezing, shortness of breath, unable to speak, severe coughing)      Sob with rest/ exertion, fatigue, have to keep mouth open 2. ONSET: When did this breathing problem begin?      4 days ago 3. PATTERN Does the difficult breathing come and go, or has it been constant since it started?      Comes and goes 4. SEVERITY: How bad is your breathing? (e.g., mild, moderate, severe)  mild 6. CARDIAC HISTORY: Do you have any history of heart disease? (e.g., heart attack, angina, bypass surgery, angioplasty)      no 7. LUNG HISTORY: Do you have any history of lung disease?  (e.g., pulmonary embolus, asthma, emphysema)     no 8. CAUSE: What do you think is causing the breathing problem?      unsure 9. OTHER SYMPTOMS: Do you have any other symptoms? (e.g., chest pain, cough, dizziness, fever, runny nose)     HA denies severe diff breathing, chest pain, faint, dizziness,  fever chills, n/v, runny nose cough,swelling tongue/face  Protocols used: Breathing Difficulty-A-AH

## 2025-02-13 ENCOUNTER — Ambulatory Visit: Admitting: Family Medicine
